# Patient Record
Sex: Male | Born: 1935 | Race: White | Hispanic: No | Marital: Married | Smoking: Former smoker
Health system: Southern US, Community
[De-identification: ages and names within clinical notes are randomized; demographics above are authoritative.]

## PROBLEM LIST (undated history)

## (undated) DIAGNOSIS — Z9109 Other allergy status, other than to drugs and biological substances: Secondary | ICD-10-CM

## (undated) DIAGNOSIS — G4730 Sleep apnea, unspecified: Secondary | ICD-10-CM

## (undated) DIAGNOSIS — I10 Essential (primary) hypertension: Secondary | ICD-10-CM

## (undated) HISTORY — PX: HERNIA REPAIR: SHX51

## (undated) HISTORY — DX: Essential (primary) hypertension: I10

## (undated) HISTORY — DX: Sleep apnea, unspecified: G47.30

## (undated) HISTORY — DX: Other allergy status, other than to drugs and biological substances: Z91.09

---

## 1999-06-12 ENCOUNTER — Ambulatory Visit (HOSPITAL_BASED_OUTPATIENT_CLINIC_OR_DEPARTMENT_OTHER): Admission: RE | Admit: 1999-06-12 | Discharge: 1999-06-12 | Payer: Self-pay | Admitting: *Deleted

## 2001-08-23 ENCOUNTER — Encounter (INDEPENDENT_AMBULATORY_CARE_PROVIDER_SITE_OTHER): Payer: Self-pay | Admitting: Specialist

## 2001-08-23 ENCOUNTER — Ambulatory Visit (HOSPITAL_COMMUNITY): Admission: RE | Admit: 2001-08-23 | Discharge: 2001-08-23 | Payer: Self-pay | Admitting: Gastroenterology

## 2005-08-28 ENCOUNTER — Encounter: Admission: RE | Admit: 2005-08-28 | Discharge: 2005-08-28 | Payer: Self-pay | Admitting: Orthopedic Surgery

## 2005-10-22 ENCOUNTER — Encounter: Admission: RE | Admit: 2005-10-22 | Discharge: 2005-10-22 | Payer: Self-pay | Admitting: Orthopedic Surgery

## 2007-04-23 ENCOUNTER — Encounter: Admission: RE | Admit: 2007-04-23 | Discharge: 2007-04-23 | Payer: Self-pay | Admitting: Family Medicine

## 2007-04-30 ENCOUNTER — Encounter: Admission: RE | Admit: 2007-04-30 | Discharge: 2007-04-30 | Payer: Self-pay | Admitting: Family Medicine

## 2007-05-13 ENCOUNTER — Encounter: Admission: RE | Admit: 2007-05-13 | Discharge: 2007-05-13 | Payer: Self-pay | Admitting: Family Medicine

## 2007-05-27 ENCOUNTER — Encounter: Admission: RE | Admit: 2007-05-27 | Discharge: 2007-05-27 | Payer: Self-pay | Admitting: Family Medicine

## 2007-06-07 ENCOUNTER — Ambulatory Visit (HOSPITAL_BASED_OUTPATIENT_CLINIC_OR_DEPARTMENT_OTHER): Admission: RE | Admit: 2007-06-07 | Discharge: 2007-06-07 | Payer: Self-pay | Admitting: Otolaryngology

## 2008-12-18 ENCOUNTER — Ambulatory Visit (HOSPITAL_COMMUNITY): Admission: RE | Admit: 2008-12-18 | Discharge: 2008-12-18 | Payer: Self-pay | Admitting: General Surgery

## 2010-05-15 ENCOUNTER — Encounter: Admission: RE | Admit: 2010-05-15 | Discharge: 2010-05-15 | Payer: Self-pay | Admitting: Family Medicine

## 2011-01-16 ENCOUNTER — Other Ambulatory Visit: Payer: Self-pay | Admitting: Family Medicine

## 2011-01-16 DIAGNOSIS — M5410 Radiculopathy, site unspecified: Secondary | ICD-10-CM

## 2011-01-23 LAB — DIFFERENTIAL
Basophils Absolute: 0 10*3/uL (ref 0.0–0.1)
Basophils Relative: 0 % (ref 0–1)
Eosinophils Absolute: 0.2 10*3/uL (ref 0.0–0.7)
Eosinophils Relative: 3 % (ref 0–5)
Lymphocytes Relative: 29 % (ref 12–46)
Lymphs Abs: 1.7 10*3/uL (ref 0.7–4.0)
Monocytes Absolute: 0.4 10*3/uL (ref 0.1–1.0)
Monocytes Relative: 6 % (ref 3–12)
Neutro Abs: 3.8 10*3/uL (ref 1.7–7.7)
Neutrophils Relative %: 62 % (ref 43–77)

## 2011-01-23 LAB — COMPREHENSIVE METABOLIC PANEL
ALT: 33 U/L (ref 0–53)
AST: 28 U/L (ref 0–37)
Albumin: 3.5 g/dL (ref 3.5–5.2)
Alkaline Phosphatase: 97 U/L (ref 39–117)
BUN: 22 mg/dL (ref 6–23)
CO2: 28 mEq/L (ref 19–32)
Calcium: 8.6 mg/dL (ref 8.4–10.5)
Chloride: 108 mEq/L (ref 96–112)
Creatinine, Ser: 1.18 mg/dL (ref 0.4–1.5)
GFR calc Af Amer: >60 mL/min (ref >60)
GFR calc non Af Amer: >60 mL/min (ref >60)
Glucose, Bld: 86 mg/dL (ref 70–99)
Potassium: 4.1 mEq/L (ref 3.5–5.1)
Sodium: 138 mEq/L (ref 135–145)
Total Bilirubin: 1 mg/dL (ref 0.3–1.2)
Total Protein: 6.2 g/dL (ref 6.0–8.3)

## 2011-01-23 LAB — CBC
HCT: 42.2 % (ref 39.0–52.0)
Hemoglobin: 14.3 g/dL (ref 13.0–17.0)
MCHC: 33.9 g/dL (ref 30.0–36.0)
MCV: 93.7 fL (ref 78.0–100.0)
Platelets: 200 10*3/uL (ref 150–400)
RBC: 4.5 MIL/uL (ref 4.22–5.81)
RDW: 12.8 % (ref 11.5–15.5)
WBC: 6.1 10*3/uL (ref 4.0–10.5)

## 2011-01-23 LAB — PROTIME-INR
INR: 1 (ref 0.00–1.49)
Prothrombin Time: 13.3 seconds (ref 11.6–15.2)

## 2011-01-23 LAB — URINALYSIS, ROUTINE W REFLEX MICROSCOPIC
Bilirubin Urine: NEGATIVE
Glucose, UA: NEGATIVE mg/dL
Hgb urine dipstick: NEGATIVE
Ketones, ur: NEGATIVE mg/dL
Nitrite: NEGATIVE
Protein, ur: NEGATIVE mg/dL
Specific Gravity, Urine: 1.02 (ref 1.005–1.030)
Urobilinogen, UA: 0.2 mg/dL (ref 0.0–1.0)
pH: 6 (ref 5.0–8.0)

## 2011-01-27 ENCOUNTER — Ambulatory Visit
Admission: RE | Admit: 2011-01-27 | Discharge: 2011-01-27 | Disposition: A | Discharge status: Home / Self Care | Payer: PRIVATE HEALTH INSURANCE | Source: Ambulatory Visit | Attending: Family Medicine | Admitting: Family Medicine

## 2011-01-27 DIAGNOSIS — M5410 Radiculopathy, site unspecified: Secondary | ICD-10-CM

## 2011-02-25 NOTE — Procedures (Signed)
NAME:  Jeremy Finley, Jeremy Finley               ACCOUNT NO.:  [id number]   MEDICAL RECORD NO.:  [id number]          PATIENT TYPE:  OUT   LOCATION:  SLEEP CENTER                 FACILITY:  [facility]   PHYSICIAN:  Clinton D. Maple Hudson, MD, FCCP, FACPDATE OF BIRTH:  08-19-37   DATE OF STUDY:  06/07/2007                            NOCTURNAL POLYSOMNOGRAM   REFERRING PHYSICIAN:  Suzanna Obey, M.D.   INDICATION FOR STUDY:  Hypersomnia with sleep apnea.   EPWORTH SLEEPINESS SCORE:  6/24, height 5 feet 10 inches, weight 177  pounds.   HOME MEDICATIONS:  Listed and reviewed.   SLEEP ARCHITECTURE:  Total sleep time 333 minutes.  Sleep efficiency  74%.  Stage I was 19%, stage II 80%, stage III is absent, REM 1% of  total sleep time.  Sleep latency 23 minutes.  REM latency 381 minutes.  Awake after sleep onset 97 minutes.  Arousal index 11.2.  No bedtime  medication was reported.   RESPIRATORY DATA:  Split study protocol.  Apnea/hypopnea index (AHI,  RDI) 35 obstructive events per hour indicating moderately severe  obstructive sleep apnea/hypopnea syndrome before CPAP.  This included 1  obstructive apnea and 109 hypopneas before CPAP.  The events and sleep  were mostly while supine.  REM AHI zero.  CPAP was titrated to 14-CWP,  AHI zero per hour.  A small Mirage Quattro full face mask was chosen  with heated humidifier.   OXYGEN DATA:  Very loud snoring before CPAP with oxygen desaturation to  a nadir of 84%.  After CPAP titration saturation held 94-96% on room  air.   CARDIAC DATA:  Normal sinus rhythm.   MOVEMENT-PARASOMNIA:  Limb jerks were frequent but rarely associated  with sleep disturbance and insignificant.  No bathroom breaks.   IMPRESSIONS-RECOMMENDATIONS:  1. Moderate obstructive sleep apnea/hypopnea syndrome, AHI 19.8 per      hour with most sleep and most events while supine.  Loud snoring      with oxygen desaturation to a nadir of 84%.  2. Successful CPAP titration to 14-CWP, AHI zero per  hour.  A small      Mirage Quattro full face mask was chosen with heated humidifier.      Clinton D. Maple Hudson, MD, [facility], FACP  Diplomate, Biomedical engineer of Sleep Medicine  Electronically Signed     CDY/MEDQ  D:  06/13/2007 13:44:41  T:  06/13/2007 04:54:09  Job:  [id number]

## 2011-02-25 NOTE — Op Note (Signed)
Jeremy Finley, Jeremy Finley               ACCOUNT NO.:  [id number]   MEDICAL RECORD NO.:  [id number]          PATIENT TYPE:  AMB   LOCATION:  DAY                          FACILITY:  [facility]   PHYSICIAN:  Adolph Pollack, M.D.DATE OF BIRTH:  1935-06-15   DATE OF PROCEDURE:  12/18/2008  DATE OF DISCHARGE:                               OPERATIVE REPORT   PREOPERATIVE DIAGNOSIS:  Left inguinal hernia.   POSTOPERATIVE DIAGNOSIS:  Indirect left inguinal hernia.   PROCEDURE:  Laparoscopic left inguinal repair with mesh.   SURGEON:  Adolph Pollack, M.D.   ANESTHESIA:  General plus Marcaine local.   INDICATION:  Jeremy Finley is a 77 year old male who was doing heavy work  in the summer then noticed a bulge in the left groin which he had to  reduce.  He had sporadically have discomfort from the area and has a  left inguinal hernia on exam and now presents for laparoscopic repair.  Procedure and risks were discussed with him preoperatively as well as  request for him to hold his aspirin for 5 days preop.   TECHNIQUE:  He was seen in the holding area and the left groin marked  with my initials.  He voided and was taken to the operating room, placed  supine on the operating table.  General anesthetic was administered.  The hair on the abdominal wall and groin area was clipped and the area  sterilely prepped and draped.  Local anesthetic was infiltrated in the  subumbilical region.  A small subumbilical incision was made through the  skin and subcutaneous tissue.  The left anterior rectus sheath was  identified and a small transverse incision made in it.  The underlying  left rectus muscle was then swept laterally exposing the posterior  rectus sheath.  A balloon dissection device was then placed into the  extraperitoneal space and under laparoscopic vision, balloon dissection  was performed.   The balloon was removed and CO2 gas was insufflated.  The laparoscope  was introduced and two 5 mm  trocars were placed just to the right of the  lower midline into the extraperitoneal space.   Using blunt dissection, I identified the symphysis pubis and Cooper's  ligament on the left in the direct area which appeared to be solid.  I  then used blunt dissection to dissect fibrofatty tissue away from the  anterior lateral abdominal walls to level of the umbilicus.  There is a  small amount of bleeding for branch of the inferior epigastric vein  which was clipped and controlled.   I then identified the spermatic cord and indirect sac.  There is also  extraperitoneal fat going up through a patulous internal ring which I  reduced.  I manipulated I dissected the sac free from the cord.  A small  hole was made in the sac but I was able to dissect the sac free from the  cord and fold the peritoneum back on itself, sealing the hole.   I did note that his tissues did seem to ooze easily with any dissected  tissue, but then this  oozing stopped after time.   A piece of Parietex mesh was brought into the field and cut to be 5 x 6  inches.  A partial longitudinal slit was cut into it and it was placed  into the left extraperitoneal space and positioned appropriately so the  two tails of the mesh were wrapped around the spermatic cord.  The mesh  was then anchored to the Cooper's ligament, the anterior and lateral  abdominal walls with spiral tacks providing adequate coverage with good  overlap of the indirect, direct and femoral spaces.   I then inspected the area and I saw no active bleeding at the time.  I  released the CO2 gas and watched the extraperitoneal contents  approximate the mesh.  I then removed all the trocars.   I then closed the anterior rectus sheath defect with interrupted 0  Vicryl sutures.  Some bleeding from subcutaneous tissue was controlled  with electrocautery.  All the skin incisions were then closed with 4-0  Monocryl subcuticular stitches followed by Steri-Strips and  sterile  dressings.   He tolerated the procedure well without any apparent complications and  was taken to recovery room in satisfactory condition.      Adolph Pollack, M.D.  Electronically Signed     TJR/MEDQ  D:  12/18/2008  T:  12/18/2008  Job:  [id number]   cc:   Mosetta Putt, M.D.  Fax: [phone]

## 2011-10-03 ENCOUNTER — Institutional Professional Consult (permissible substitution): Payer: Medicare Other | Admitting: Internal Medicine

## 2011-10-17 ENCOUNTER — Ambulatory Visit (INDEPENDENT_AMBULATORY_CARE_PROVIDER_SITE_OTHER): Payer: PRIVATE HEALTH INSURANCE | Admitting: Internal Medicine

## 2011-10-17 ENCOUNTER — Encounter: Payer: Self-pay | Admitting: Internal Medicine

## 2011-10-17 VITALS — BP 126/70 | HR 56 | Ht 70.0 in | Wt 191.0 lb

## 2011-10-17 DIAGNOSIS — G4733 Obstructive sleep apnea (adult) (pediatric): Secondary | ICD-10-CM

## 2011-10-17 NOTE — Progress Notes (Signed)
10/17/11 32 yoM former smoker referred courtesy of Dr. Duaine Dredge because of sleep apnea. He was originally diagnosed in 2008 after complaints of loud snoring and daytime sleepiness. NPSG 06/07/07 showed moderate obstructive sleep apnea, AHI 19.8 per hour with loud snoring and desaturation to 84% on room air. He was successfully titrated to CPAP of 14 and has used that since/SMS DME.  He is quite prepared to continue using CPAP as he has done, all night every night. We that he admits he does not snore and no longer has daytime sleepiness. He does find it a nuisance.  Bedtime now between 11 and 11:30 PM with sleep latency less than 5 minutes. He rarely wakes during the night before getting up at 5:30 AM. He is going to Weight Watchers and has lost 15 pounds. At his original study he weighed 177 pounds. He denies significant nasal congestion but has some history of allergic rhinitis in the past. He denies cardiopulmonary disease but is treated for hypertension. Tonsillectomy as a child. Family history of heart disease.  ROS-see HPI Constitutional:   Deliberate  weight loss, No- night sweats, fevers, chills, fatigue, lassitude. HEENT:   No-  headaches, difficulty swallowing, tooth/dental problems, sore throat,       No-  sneezing, itching, ear ache, nasal congestion, post nasal drip,  CV:  No-   chest pain, orthopnea, PND, swelling in lower extremities, anasarca, dizziness, palpitations Resp: No-   shortness of breath with exertion or at rest.              No-   productive cough,  No non-productive cough,  No- coughing up of blood.              No-   change in color of mucus.  No- wheezing.   Skin: No-   rash or lesions. GI:  No-   heartburn, indigestion, abdominal pain, nausea, vomiting, diarrhea,                 change in bowel habits, loss of appetite GU:  MS:  No-   joint pain or swelling.  No- decreased range of motion.  No- back pain. Neuro-     nothing unusual Psych:  No- change in mood or  affect. No depression or anxiety.  No memory loss.  OBJ General- Alert, Oriented, Affect-appropriate, Distress- none acute, mildly overweight Skin- rash-none, lesions- none, excoriation- none Lymphadenopathy- none Head- atraumatic            Eyes- Gross vision intact, PERRLA, conjunctivae clear secretions            Ears- Hearing, canals-normal            Nose- seems stuffy but visually normal, no-Septal dev, mucus, polyps, erosion, perforation             Throat- Mallampati II , mucosa clear , drainage- none, tonsils- absent, thick tongue base Neck- flexible , trachea midline, no stridor , thyroid nl, carotid no bruit Chest - symmetrical excursion , unlabored           Heart/CV- RRR , no murmur , no gallop  , no rub, nl s1 s2                           - JVD- none , edema- none, stasis changes- none, varices- none           Lung- clear to P&A, wheeze- none, cough- none , dullness-none, rub-  none           Chest wall-  Abd- Br/ Gen/ Rectal- Not done, not indicated Extrem- cyanosis- none, clubbing, none, atrophy- none, strength- nl Neuro- grossly intact to observation

## 2011-10-17 NOTE — Patient Instructions (Addendum)
I think your current CPAP at 14 is still a very good solution to your sleep apnea problem.   As the current machine gets older, it will be ok to get it replaced while you can keep this one for a working spare.  You might like to arrange to see Orthodontist Dr Althea Grimmer in [place] for consideration of an oral appliance to treat snoring and sleep apnea. Some people will use these exclusively or instead of CPAP while travelling.

## 2011-10-19 DIAGNOSIS — G4733 Obstructive sleep apnea (adult) (pediatric): Secondary | ICD-10-CM | POA: Insufficient documentation

## 2011-10-19 NOTE — Assessment & Plan Note (Addendum)
He admits good sleep and good control of symptoms of sleep apnea with CPAP at 14 CWP. He wishes he didn't need it. We went through a detailed discussion of the medical issues surrounding untreated sleep apnea and available treatments as well as a discussion of the impact of weight and his responsibility to be alert while driving. He would be interested in learning more about oral appliances and I have recommended Dr. Althea Grimmer, orthodontist with significant experience working with these devices for sleep apnea. We discussed other alternatives including surgery and the Provent valves.Marland Kitchen

## 2012-01-20 ENCOUNTER — Telehealth: Payer: Self-pay | Admitting: Internal Medicine

## 2012-01-20 DIAGNOSIS — G4733 Obstructive sleep apnea (adult) (pediatric): Secondary | ICD-10-CM

## 2012-01-20 NOTE — Telephone Encounter (Signed)
Per Dr. Henrietta Hoover office, pt needs a referral to them for the dental appliance. Pls advise if okay to send. This was the plan per 10/17/11 OV.

## 2012-01-20 NOTE — Telephone Encounter (Signed)
Order placed to [facility] to send order to Dr Myrtis Ser for oral appliance

## 2012-01-20 NOTE — Telephone Encounter (Signed)
Yes  Order referral to Dr Althea Grimmer for oral appliance to treat sleep apnea and snoring,   Dx OSA

## 2013-08-18 ENCOUNTER — Ambulatory Visit
Admission: RE | Admit: 2013-08-18 | Discharge: 2013-08-18 | Disposition: A | Discharge status: Home / Self Care | Payer: Medicare Other | Source: Ambulatory Visit | Attending: Family Medicine | Admitting: Family Medicine

## 2013-08-18 ENCOUNTER — Other Ambulatory Visit: Payer: Self-pay | Admitting: Family Medicine

## 2013-08-18 DIAGNOSIS — R0989 Other specified symptoms and signs involving the circulatory and respiratory systems: Secondary | ICD-10-CM

## 2013-10-03 ENCOUNTER — Ambulatory Visit (INDEPENDENT_AMBULATORY_CARE_PROVIDER_SITE_OTHER): Payer: Medicare Other | Admitting: Internal Medicine

## 2013-10-03 ENCOUNTER — Encounter: Payer: Self-pay | Admitting: Internal Medicine

## 2013-10-03 ENCOUNTER — Encounter (INDEPENDENT_AMBULATORY_CARE_PROVIDER_SITE_OTHER): Payer: Self-pay

## 2013-10-03 VITALS — BP 140/88 | HR 55 | Ht 70.0 in | Wt 197.0 lb

## 2013-10-03 DIAGNOSIS — G4733 Obstructive sleep apnea (adult) (pediatric): Secondary | ICD-10-CM

## 2013-10-03 NOTE — Patient Instructions (Signed)
Order- DME  [facility]- replacement CPAP mask of choice and supplies. Continue pressure at 14.   Dx OSA  Please call as needed

## 2013-10-03 NOTE — Progress Notes (Signed)
10/17/11 46 yoM former smoker referred courtesy of Dr. Duaine Dredge because of sleep apnea. He was originally diagnosed in 2008 after complaints of loud snoring and daytime sleepiness. NPSG 06/07/07 showed moderate obstructive sleep apnea, AHI 19.8 per hour with loud snoring and desaturation to 84% on room air. He was successfully titrated to CPAP of 14 and has used that since/SMS DME.  He is quite prepared to continue using CPAP as he has done, all night every night. We that he admits he does not snore and no longer has daytime sleepiness. He does find it a nuisance.  Bedtime now between 11 and 11:30 PM with sleep latency less than 5 minutes. He rarely wakes during the night before getting up at 5:30 AM. He is going to Weight [employer]chers and has lost 15 pounds. At his original study he weighed 177 pounds. He denies significant nasal congestion but has some history of allergic rhinitis in the past. He denies cardiopulmonary disease but is treated for hypertension. Tonsillectomy as a child. Family history of heart disease.  10/03/13- 29 yoM former smoker referred courtesy of Dr. Duaine Dredge because of sleep apnea. FOLLOWS FOR:  Not wearing CPAP x3 months due to leakage in mask.  [facility] needs order placed and OV with Dr. to recieve anything  CPAP 14/[facility]. He says it worked very well when the mask fit without leaking. He did not like oral appliance-ineffective.  ROS-see HPI Constitutional:   Deliberate  weight loss, No- night sweats, fevers, chills, fatigue, lassitude. HEENT:   No-  headaches, difficulty swallowing, tooth/dental problems, sore throat,       No-  sneezing, itching, ear ache, nasal congestion, post nasal drip,  CV:  No-   chest pain, orthopnea, PND, swelling in lower extremities, anasarca, dizziness, palpitations Resp: No-   shortness of breath with exertion or at rest.              No-   productive cough,  No non-productive cough,  No- coughing up of blood.              No-   change in color  of mucus.  No- wheezing.   Skin: No-   rash or lesions. GI:  No-   heartburn, indigestion, abdominal pain, nausea, vomiting,  GU:  MS:  No-   joint pain or swelling.  . Neuro-     nothing unusual Psych:  No- change in mood or affect. No depression or anxiety.  No memory loss.  OBJ General- Alert, Oriented, Affect-appropriate, Distress- none acute, mildly overweight Skin- rash-none, lesions- none, excoriation- none Lymphadenopathy- none Head- atraumatic            Eyes- Gross vision intact, PERRLA, conjunctivae clear secretions            Ears- Hearing, canals-normal            Nose- seems stuffy but visually normal, no-Septal dev, mucus, polyps, erosion, perforation             Throat- Mallampati II , mucosa clear , drainage- none, tonsils- absent,  thick tongue base Neck- flexible , trachea midline, no stridor , thyroid nl, carotid no bruit Chest - symmetrical excursion , unlabored           Heart/CV- RRR , no murmur , no gallop  , no rub, nl s1 s2                           -  JVD- none , edema- none, stasis changes- none, varices- none           Lung- clear to P&A, wheeze- none, cough- none , dullness-none, rub- none           Chest wall-  Abd- Br/ Gen/ Rectal- Not done, not indicated Extrem- cyanosis- none, clubbing, none, atrophy- none, strength- nl Neuro- grossly intact to observation

## 2013-10-18 DIAGNOSIS — E785 Hyperlipidemia, unspecified: Secondary | ICD-10-CM | POA: Diagnosis not present

## 2013-10-24 ENCOUNTER — Other Ambulatory Visit: Payer: Self-pay | Admitting: Family Medicine

## 2013-10-24 DIAGNOSIS — E785 Hyperlipidemia, unspecified: Secondary | ICD-10-CM | POA: Diagnosis not present

## 2013-10-24 DIAGNOSIS — M5416 Radiculopathy, lumbar region: Secondary | ICD-10-CM

## 2013-10-24 DIAGNOSIS — M5126 Other intervertebral disc displacement, lumbar region: Secondary | ICD-10-CM | POA: Diagnosis not present

## 2013-10-29 NOTE — Assessment & Plan Note (Signed)
Good compliance and control until his mask failed. Plan-continue CPAP at 14/[facility]. Replacement mask and supplies

## 2013-10-31 ENCOUNTER — Inpatient Hospital Stay: Admission: RE | Admit: 2013-10-31 | Payer: Medicare Other | Source: Ambulatory Visit

## 2013-11-01 DIAGNOSIS — L570 Actinic keratosis: Secondary | ICD-10-CM | POA: Diagnosis not present

## 2013-11-01 DIAGNOSIS — L719 Rosacea, unspecified: Secondary | ICD-10-CM | POA: Diagnosis not present

## 2013-11-03 ENCOUNTER — Ambulatory Visit
Admission: RE | Admit: 2013-11-03 | Discharge: 2013-11-03 | Disposition: A | Discharge status: Home / Self Care | Payer: Medicare Other | Source: Ambulatory Visit | Attending: Family Medicine | Admitting: Family Medicine

## 2013-11-03 DIAGNOSIS — M5137 Other intervertebral disc degeneration, lumbosacral region: Secondary | ICD-10-CM | POA: Diagnosis not present

## 2013-11-03 DIAGNOSIS — M5416 Radiculopathy, lumbar region: Secondary | ICD-10-CM

## 2013-11-03 DIAGNOSIS — M47817 Spondylosis without myelopathy or radiculopathy, lumbosacral region: Secondary | ICD-10-CM | POA: Diagnosis not present

## 2013-11-08 ENCOUNTER — Other Ambulatory Visit: Payer: Self-pay | Admitting: Family Medicine

## 2013-11-08 DIAGNOSIS — IMO0002 Reserved for concepts with insufficient information to code with codable children: Secondary | ICD-10-CM

## 2013-11-21 ENCOUNTER — Other Ambulatory Visit: Payer: Medicare Other

## 2013-11-22 ENCOUNTER — Ambulatory Visit
Admission: RE | Admit: 2013-11-22 | Discharge: 2013-11-22 | Disposition: A | Discharge status: Home / Self Care | Payer: Medicare Other | Source: Ambulatory Visit | Attending: Family Medicine | Admitting: Family Medicine

## 2013-11-22 DIAGNOSIS — IMO0002 Reserved for concepts with insufficient information to code with codable children: Secondary | ICD-10-CM

## 2013-11-22 DIAGNOSIS — M545 Low back pain, unspecified: Secondary | ICD-10-CM | POA: Diagnosis not present

## 2013-11-22 DIAGNOSIS — M47817 Spondylosis without myelopathy or radiculopathy, lumbosacral region: Secondary | ICD-10-CM | POA: Diagnosis not present

## 2013-11-22 MED ORDER — METHYLPREDNISOLONE ACETATE 40 MG/ML INJ SUSP (RADIOLOG
120.0000 mg | Freq: Once | INTRAMUSCULAR | Status: AC
  Administered 2013-11-22: 120 mg via EPIDURAL

## 2013-11-22 MED ORDER — IOHEXOL 180 MG/ML  SOLN
1.0000 mL | Freq: Once | INTRAMUSCULAR | Status: AC | PRN
  Administered 2013-11-22: 1 mL via EPIDURAL

## 2013-11-22 NOTE — Discharge Instructions (Signed)

## 2013-12-02 ENCOUNTER — Encounter: Payer: Self-pay | Admitting: Internal Medicine

## 2013-12-02 ENCOUNTER — Ambulatory Visit (INDEPENDENT_AMBULATORY_CARE_PROVIDER_SITE_OTHER): Payer: Medicare Other | Admitting: Internal Medicine

## 2013-12-02 VITALS — BP 124/80 | HR 58 | Ht 70.0 in | Wt 196.0 lb

## 2013-12-02 DIAGNOSIS — G4733 Obstructive sleep apnea (adult) (pediatric): Secondary | ICD-10-CM

## 2013-12-02 NOTE — Progress Notes (Signed)
10/17/11 48 yoM former smoker referred courtesy of Dr. Sandi Mariscal because of sleep apnea. He was originally diagnosed in 2008 after complaints of loud snoring and daytime sleepiness. NPSG 06/07/07 showed moderate obstructive sleep apnea, AHI 19.8 per hour with loud snoring and desaturation to 84% on room air. He was successfully titrated to CPAP of 14 and has used that since/SMS DME.  He is quite prepared to continue using CPAP as he has done, all night every night. We that he admits he does not snore and no longer has daytime sleepiness. He does find it a nuisance.  Bedtime now between 11 and 11:30 PM with sleep latency less than 5 minutes. He rarely wakes during the night before getting up at 5:30 AM. He is going to Weight Watchers and has lost 15 pounds. At his original study he weighed 177 pounds. He denies significant nasal congestion but has some history of allergic rhinitis in the past. He denies cardiopulmonary disease but is treated for hypertension. Tonsillectomy as a child. Family history of heart disease.  10/03/13- 68 yoM former smoker referred courtesy of Dr. Sandi Mariscal because of sleep apnea. FOLLOWS FOR:  Not wearing CPAP x3 months due to leakage in mask.  [facility] needs order placed and OV with Dr. to recieve anything  CPAP 14/[facility]. He says it worked very well when the mask fit without leaking. He did not like oral appliance-ineffective.  12/02/13- 8 yoM former smoker referred courtesy of Dr. Sandi Mariscal because of sleep apnea. FOLLOWS FOR:  Wearing CPAP 14/ [facility]  6-8 hours per night--No concerns today Failed oral appliance with Dr. Rodney Langton download is pending  ROS-see HPI Constitutional:   Deliberate  weight loss, No- night sweats, fevers, chills, fatigue, lassitude. HEENT:   No-  headaches, difficulty swallowing, tooth/dental problems, sore throat,       No-  sneezing, itching, ear ache, nasal congestion, post nasal drip,  CV:  No-   chest pain, orthopnea, PND, swelling in  lower extremities, anasarca, dizziness, palpitations Resp: No-   shortness of breath with exertion or at rest.              No-   productive cough,  No non-productive cough,  No- coughing up of blood.              No-   change in color of mucus.  No- wheezing.   Skin: No-   rash or lesions. GI:  No-   heartburn, indigestion, abdominal pain, nausea, vomiting,  GU:  MS:  No-   joint pain or swelling.  . Neuro-     nothing unusual Psych:  No- change in mood or affect. No depression or anxiety.  No memory loss.  OBJ General- Alert, Oriented, Affect-appropriate, Distress- none acute, mildly overweight Skin- rash-none, lesions- none, excoriation- none Lymphadenopathy- none Head- atraumatic            Eyes- Gross vision intact, PERRLA, conjunctivae clear secretions            Ears- Hearing, canals-normal            Nose- seems stuffy but visually normal, no-Septal dev, mucus, polyps, erosion, perforation             Throat- Mallampati II , mucosa clear , drainage- none, tonsils- absent,  thick tongue base Neck- flexible , trachea midline, no stridor , thyroid nl, carotid no bruit Chest - symmetrical excursion , unlabored           Heart/CV- RRR ,  no murmur , no gallop  , no rub, nl s1 s2                           - JVD- none , edema- none, stasis changes- none, varices- none           Lung- clear to P&A, wheeze- none, cough- none , dullness-none, rub- none           Chest wall-  Abd- Br/ Gen/ Rectal- Not done, not indicated Extrem- cyanosis- none, clubbing, none, atrophy- none, strength- nl Neuro- grossly intact to observation

## 2013-12-02 NOTE — Patient Instructions (Signed)
We can continue CPAP 14/ [facility]  We will be watching for the download. If that results suggests a different pressure might be better, we can ask [facility] to make that change. Please let us know if we make a change you don't like.

## 2013-12-15 ENCOUNTER — Other Ambulatory Visit: Payer: Self-pay | Admitting: Family Medicine

## 2013-12-15 DIAGNOSIS — IMO0002 Reserved for concepts with insufficient information to code with codable children: Secondary | ICD-10-CM

## 2013-12-25 NOTE — Assessment & Plan Note (Signed)
He is satisfied to continue CPAP 14. We discussed comfort issues. Waiting for download.

## 2014-01-02 ENCOUNTER — Telehealth: Payer: Self-pay | Admitting: Internal Medicine

## 2014-01-02 NOTE — Telephone Encounter (Signed)
Pt called back & states that he called the wrong doctor & nothing further is needed at this time.  Jeremy Finley

## 2014-01-03 ENCOUNTER — Other Ambulatory Visit: Payer: Medicare Other

## 2014-01-12 ENCOUNTER — Ambulatory Visit
Admission: RE | Admit: 2014-01-12 | Discharge: 2014-01-12 | Disposition: A | Discharge status: Home / Self Care | Payer: Medicare Other | Source: Ambulatory Visit | Attending: Family Medicine | Admitting: Family Medicine

## 2014-01-12 DIAGNOSIS — M545 Low back pain, unspecified: Secondary | ICD-10-CM | POA: Diagnosis not present

## 2014-01-12 DIAGNOSIS — IMO0002 Reserved for concepts with insufficient information to code with codable children: Secondary | ICD-10-CM

## 2014-01-12 MED ORDER — METHYLPREDNISOLONE ACETATE 40 MG/ML INJ SUSP (RADIOLOG
120.0000 mg | Freq: Once | INTRAMUSCULAR | Status: AC
  Administered 2014-01-12: 120 mg via EPIDURAL

## 2014-01-12 MED ORDER — IOHEXOL 180 MG/ML  SOLN
1.0000 mL | Freq: Once | INTRAMUSCULAR | Status: AC | PRN
  Administered 2014-01-12: 1 mL via EPIDURAL

## 2014-01-12 NOTE — Discharge Instructions (Signed)

## 2014-01-16 DIAGNOSIS — E782 Mixed hyperlipidemia: Secondary | ICD-10-CM | POA: Diagnosis not present

## 2014-01-16 DIAGNOSIS — E785 Hyperlipidemia, unspecified: Secondary | ICD-10-CM | POA: Diagnosis not present

## 2014-01-20 DIAGNOSIS — M5137 Other intervertebral disc degeneration, lumbosacral region: Secondary | ICD-10-CM | POA: Diagnosis not present

## 2014-01-20 DIAGNOSIS — E785 Hyperlipidemia, unspecified: Secondary | ICD-10-CM | POA: Diagnosis not present

## 2014-02-16 DIAGNOSIS — H5210 Myopia, unspecified eye: Secondary | ICD-10-CM | POA: Diagnosis not present

## 2014-02-16 DIAGNOSIS — H52229 Regular astigmatism, unspecified eye: Secondary | ICD-10-CM | POA: Diagnosis not present

## 2014-02-16 DIAGNOSIS — H2589 Other age-related cataract: Secondary | ICD-10-CM | POA: Diagnosis not present

## 2014-02-16 DIAGNOSIS — H10439 Chronic follicular conjunctivitis, unspecified eye: Secondary | ICD-10-CM | POA: Diagnosis not present

## 2014-04-03 DIAGNOSIS — R404 Transient alteration of awareness: Secondary | ICD-10-CM | POA: Diagnosis not present

## 2014-04-03 DIAGNOSIS — M5137 Other intervertebral disc degeneration, lumbosacral region: Secondary | ICD-10-CM | POA: Diagnosis not present

## 2014-04-03 DIAGNOSIS — M25569 Pain in unspecified knee: Secondary | ICD-10-CM | POA: Diagnosis not present

## 2014-04-11 ENCOUNTER — Other Ambulatory Visit: Payer: Self-pay | Admitting: Family Medicine

## 2014-04-11 DIAGNOSIS — M5136 Other intervertebral disc degeneration, lumbar region: Secondary | ICD-10-CM

## 2014-04-26 ENCOUNTER — Ambulatory Visit
Admission: RE | Admit: 2014-04-26 | Discharge: 2014-04-26 | Disposition: A | Discharge status: Home / Self Care | Payer: Medicare Other | Source: Ambulatory Visit | Attending: Family Medicine | Admitting: Family Medicine

## 2014-04-26 VITALS — BP 144/73 | HR 52

## 2014-04-26 DIAGNOSIS — IMO0002 Reserved for concepts with insufficient information to code with codable children: Secondary | ICD-10-CM | POA: Diagnosis not present

## 2014-04-26 DIAGNOSIS — M5136 Other intervertebral disc degeneration, lumbar region: Secondary | ICD-10-CM

## 2014-04-26 MED ORDER — IOHEXOL 180 MG/ML  SOLN
1.0000 mL | Freq: Once | INTRAMUSCULAR | Status: AC | PRN
  Administered 2014-04-26: 1 mL via EPIDURAL

## 2014-04-26 MED ORDER — METHYLPREDNISOLONE ACETATE 40 MG/ML INJ SUSP (RADIOLOG
120.0000 mg | Freq: Once | INTRAMUSCULAR | Status: AC
  Administered 2014-04-26: 120 mg via EPIDURAL

## 2014-04-26 NOTE — Discharge Instructions (Signed)

## 2014-05-16 DIAGNOSIS — Z85828 Personal history of other malignant neoplasm of skin: Secondary | ICD-10-CM | POA: Diagnosis not present

## 2014-05-16 DIAGNOSIS — L570 Actinic keratosis: Secondary | ICD-10-CM | POA: Diagnosis not present

## 2014-05-16 DIAGNOSIS — L819 Disorder of pigmentation, unspecified: Secondary | ICD-10-CM | POA: Diagnosis not present

## 2014-05-16 DIAGNOSIS — L821 Other seborrheic keratosis: Secondary | ICD-10-CM | POA: Diagnosis not present

## 2014-07-31 DIAGNOSIS — Z23 Encounter for immunization: Secondary | ICD-10-CM | POA: Diagnosis not present

## 2014-10-03 ENCOUNTER — Encounter: Payer: Self-pay | Admitting: Internal Medicine

## 2014-10-03 ENCOUNTER — Ambulatory Visit (INDEPENDENT_AMBULATORY_CARE_PROVIDER_SITE_OTHER): Payer: Medicare Other | Admitting: Internal Medicine

## 2014-10-03 ENCOUNTER — Encounter (INDEPENDENT_AMBULATORY_CARE_PROVIDER_SITE_OTHER): Payer: Self-pay

## 2014-10-03 VITALS — BP 128/80 | HR 49 | Ht 70.0 in | Wt 204.0 lb

## 2014-10-03 DIAGNOSIS — G4733 Obstructive sleep apnea (adult) (pediatric): Secondary | ICD-10-CM

## 2014-10-03 NOTE — Progress Notes (Signed)
10/17/11 89 yoM former smoker referred courtesy of Dr. Sandi Mariscal because of sleep apnea. He was originally diagnosed in 2008 after complaints of loud snoring and daytime sleepiness. NPSG 06/07/07 showed moderate obstructive sleep apnea, AHI 19.8 per hour with loud snoring and desaturation to 84% on room air. He was successfully titrated to CPAP of 14 and has used that since/SMS DME.  He is quite prepared to continue using CPAP as he has done, all night every night. We that he admits he does not snore and no longer has daytime sleepiness. He does find it a nuisance.  Bedtime now between 11 and 11:30 PM with sleep latency less than 5 minutes. He rarely wakes during the night before getting up at 5:30 AM. He is going to Weight Watchers and has lost 15 pounds. At his original study he weighed 177 pounds. He denies significant nasal congestion but has some history of allergic rhinitis in the past. He denies cardiopulmonary disease but is treated for hypertension. Tonsillectomy as a child. Family history of heart disease.  10/03/13- 26 yoM former smoker referred courtesy of Dr. Sandi Mariscal because of sleep apnea. FOLLOWS FOR:  Not wearing CPAP x3 months due to leakage in mask.  [facility] needs order placed and OV with Dr. to recieve anything  CPAP 14/[facility]. He says it worked very well when the mask fit without leaking. He did not like oral appliance-ineffective.  12/02/13- 61 yoM former smoker referred courtesy of Dr. Sandi Mariscal because of sleep apnea. FOLLOWS FOR:  Wearing CPAP 14/ [facility]  6-8 hours per night--No concerns today Failed oral appliance with Dr. Ron Parker New download is pending  10/03/14- 29 yoM former smoker referred courtesy of Dr. Sandi Mariscal because of sleep apnea. FOLLOWS FOR: patient wears cpap 14/ [facility] 8 hours nightly. He is not having any problems.  We discussed his nasal pillows mask and comfort issues. He describes good compliance and control. Still using an oral appliance for travel but  he is concerned it would hurt his TM joints with long-term use. He feels he sleeps well.  ROS-see HPI Constitutional:   Deliberate  weight loss, No- night sweats, fevers, chills, fatigue, lassitude. HEENT:   No-  headaches, difficulty swallowing, tooth/dental problems, sore throat,       No-  sneezing, itching, ear ache, nasal congestion, post nasal drip,  CV:  No-   chest pain, orthopnea, PND, swelling in lower extremities, anasarca, dizziness, palpitations Resp: No-   shortness of breath with exertion or at rest.              No-   productive cough,  No non-productive cough,  No- coughing up of blood.              No-   change in color of mucus.  No- wheezing.   Skin: No-   rash or lesions. GI:  No-   heartburn, indigestion, abdominal pain, nausea, vomiting,  GU:  MS:  No-   joint pain or swelling.  . Neuro-     nothing unusual Psych:  No- change in mood or affect. No depression or anxiety.  No memory loss.  OBJ General- Alert, Oriented, Affect-appropriate, Distress- none acute, trim Skin- rash-none, lesions- none, excoriation- none Lymphadenopathy- none Head- atraumatic            Eyes- Gross vision intact, PERRLA, conjunctivae clear secretions            Ears- Hearing, canals-normal  Nose- seems stuffy but visually normal, no-Septal dev, mucus, polyps, erosion, perforation             Throat- Mallampati III , mucosa clear , drainage- none, tonsils- absent,  thick tongue base Neck- flexible , trachea midline, no stridor , thyroid nl, carotid no bruit Chest - symmetrical excursion , unlabored           Heart/CV- RRR , no murmur , no gallop  , no rub, nl s1 s2                           - JVD- none , edema- none, stasis changes- none, varices- none           Lung- clear to P&A, wheeze- none, cough- none , dullness-none, rub- none           Chest wall-  Abd- Br/ Gen/ Rectal- Not done, not indicated Extrem- cyanosis- none, clubbing, none, atrophy- none, strength- nl Neuro-  grossly intact to observation

## 2014-10-03 NOTE — Assessment & Plan Note (Signed)
Good compliance and control with no concerns identified. Comfort issues reviewed.

## 2014-10-03 NOTE — Patient Instructions (Signed)
We can continue CPAP 14/ Advanced, with the oral apliance for travel as you have been doing. Please call if we can help

## 2014-10-20 DIAGNOSIS — Z125 Encounter for screening for malignant neoplasm of prostate: Secondary | ICD-10-CM | POA: Diagnosis not present

## 2014-12-04 ENCOUNTER — Ambulatory Visit: Payer: Medicare Other | Admitting: Internal Medicine

## 2015-01-12 ENCOUNTER — Ambulatory Visit
Admission: RE | Admit: 2015-01-12 | Discharge: 2015-01-12 | Disposition: A | Discharge status: Home / Self Care | Payer: Medicare HMO | Source: Ambulatory Visit | Attending: Family Medicine | Admitting: Family Medicine

## 2015-01-12 ENCOUNTER — Other Ambulatory Visit: Payer: Self-pay | Admitting: Family Medicine

## 2015-01-12 DIAGNOSIS — J181 Lobar pneumonia, unspecified organism: Principal | ICD-10-CM

## 2015-01-12 DIAGNOSIS — J189 Pneumonia, unspecified organism: Secondary | ICD-10-CM

## 2015-03-08 ENCOUNTER — Other Ambulatory Visit: Payer: Self-pay | Admitting: Family Medicine

## 2016-06-30 ENCOUNTER — Other Ambulatory Visit: Payer: Self-pay | Admitting: Family Medicine

## 2016-06-30 ENCOUNTER — Ambulatory Visit
Admission: RE | Admit: 2016-06-30 | Discharge: 2016-06-30 | Disposition: A | Discharge status: Home / Self Care | Payer: Medicare HMO | Source: Ambulatory Visit | Attending: Family Medicine | Admitting: Family Medicine

## 2016-06-30 DIAGNOSIS — M25561 Pain in right knee: Secondary | ICD-10-CM

## 2016-06-30 MED ORDER — IOPAMIDOL (ISOVUE-M 200) INJECTION 41%: 1.0000 mL | Freq: Once | INTRAMUSCULAR | Status: DC

## 2016-06-30 MED ORDER — METHYLPREDNISOLONE ACETATE 40 MG/ML INJ SUSP (RADIOLOG: 120.0000 mg | Freq: Once | INTRAMUSCULAR | Status: DC

## 2016-07-17 DIAGNOSIS — Z23 Encounter for immunization: Secondary | ICD-10-CM | POA: Diagnosis not present

## 2016-07-21 DIAGNOSIS — D1801 Hemangioma of skin and subcutaneous tissue: Secondary | ICD-10-CM | POA: Diagnosis not present

## 2016-07-21 DIAGNOSIS — C44319 Basal cell carcinoma of skin of other parts of face: Secondary | ICD-10-CM | POA: Diagnosis not present

## 2016-07-21 DIAGNOSIS — L821 Other seborrheic keratosis: Secondary | ICD-10-CM | POA: Diagnosis not present

## 2016-07-21 DIAGNOSIS — L565 Disseminated superficial actinic porokeratosis (DSAP): Secondary | ICD-10-CM | POA: Diagnosis not present

## 2016-07-21 DIAGNOSIS — D225 Melanocytic nevi of trunk: Secondary | ICD-10-CM | POA: Diagnosis not present

## 2016-07-21 DIAGNOSIS — L570 Actinic keratosis: Secondary | ICD-10-CM | POA: Diagnosis not present

## 2016-07-21 DIAGNOSIS — D2271 Melanocytic nevi of right lower limb, including hip: Secondary | ICD-10-CM | POA: Diagnosis not present

## 2016-07-21 DIAGNOSIS — L814 Other melanin hyperpigmentation: Secondary | ICD-10-CM | POA: Diagnosis not present

## 2016-07-21 DIAGNOSIS — D485 Neoplasm of uncertain behavior of skin: Secondary | ICD-10-CM | POA: Diagnosis not present

## 2016-07-21 DIAGNOSIS — Z85828 Personal history of other malignant neoplasm of skin: Secondary | ICD-10-CM | POA: Diagnosis not present

## 2016-10-29 DIAGNOSIS — G4733 Obstructive sleep apnea (adult) (pediatric): Secondary | ICD-10-CM | POA: Diagnosis not present

## 2016-10-29 DIAGNOSIS — I10 Essential (primary) hypertension: Secondary | ICD-10-CM | POA: Diagnosis not present

## 2016-10-29 DIAGNOSIS — M25561 Pain in right knee: Secondary | ICD-10-CM | POA: Diagnosis not present

## 2016-10-29 DIAGNOSIS — R7302 Impaired glucose tolerance (oral): Secondary | ICD-10-CM | POA: Diagnosis not present

## 2016-10-29 DIAGNOSIS — I7300 Raynaud's syndrome without gangrene: Secondary | ICD-10-CM | POA: Diagnosis not present

## 2016-10-29 DIAGNOSIS — M5137 Other intervertebral disc degeneration, lumbosacral region: Secondary | ICD-10-CM | POA: Diagnosis not present

## 2016-10-29 DIAGNOSIS — E782 Mixed hyperlipidemia: Secondary | ICD-10-CM | POA: Diagnosis not present

## 2016-10-29 DIAGNOSIS — Z008 Encounter for other general examination: Secondary | ICD-10-CM | POA: Diagnosis not present

## 2016-10-29 DIAGNOSIS — Z0001 Encounter for general adult medical examination with abnormal findings: Secondary | ICD-10-CM | POA: Diagnosis not present

## 2016-10-29 DIAGNOSIS — R251 Tremor, unspecified: Secondary | ICD-10-CM | POA: Diagnosis not present

## 2016-11-19 DIAGNOSIS — I10 Essential (primary) hypertension: Secondary | ICD-10-CM | POA: Diagnosis not present

## 2016-12-12 DIAGNOSIS — J0110 Acute frontal sinusitis, unspecified: Secondary | ICD-10-CM | POA: Diagnosis not present

## 2016-12-12 DIAGNOSIS — M25561 Pain in right knee: Secondary | ICD-10-CM | POA: Diagnosis not present

## 2016-12-12 DIAGNOSIS — I10 Essential (primary) hypertension: Secondary | ICD-10-CM | POA: Diagnosis not present

## 2016-12-18 ENCOUNTER — Other Ambulatory Visit: Payer: Self-pay | Admitting: Family Medicine

## 2016-12-18 DIAGNOSIS — M5416 Radiculopathy, lumbar region: Secondary | ICD-10-CM

## 2016-12-31 ENCOUNTER — Ambulatory Visit
Admission: RE | Admit: 2016-12-31 | Discharge: 2016-12-31 | Disposition: A | Discharge status: Home / Self Care | Payer: Medicare HMO | Source: Ambulatory Visit | Attending: Family Medicine | Admitting: Family Medicine

## 2016-12-31 DIAGNOSIS — M48061 Spinal stenosis, lumbar region without neurogenic claudication: Secondary | ICD-10-CM | POA: Diagnosis not present

## 2016-12-31 DIAGNOSIS — M5416 Radiculopathy, lumbar region: Secondary | ICD-10-CM

## 2017-01-02 DIAGNOSIS — G4733 Obstructive sleep apnea (adult) (pediatric): Secondary | ICD-10-CM | POA: Diagnosis not present

## 2017-01-07 ENCOUNTER — Other Ambulatory Visit: Payer: Self-pay | Admitting: Family Medicine

## 2017-01-07 DIAGNOSIS — M545 Low back pain: Principal | ICD-10-CM

## 2017-01-07 DIAGNOSIS — G8929 Other chronic pain: Secondary | ICD-10-CM

## 2017-01-08 DIAGNOSIS — G4733 Obstructive sleep apnea (adult) (pediatric): Secondary | ICD-10-CM | POA: Diagnosis not present

## 2017-01-15 ENCOUNTER — Other Ambulatory Visit: Payer: Self-pay | Admitting: Family Medicine

## 2017-01-15 ENCOUNTER — Ambulatory Visit
Admission: RE | Admit: 2017-01-15 | Discharge: 2017-01-15 | Disposition: A | Discharge status: Home / Self Care | Payer: Medicare HMO | Source: Ambulatory Visit | Attending: Family Medicine | Admitting: Family Medicine

## 2017-01-15 DIAGNOSIS — M545 Low back pain: Principal | ICD-10-CM

## 2017-01-15 DIAGNOSIS — G8929 Other chronic pain: Secondary | ICD-10-CM

## 2017-01-15 DIAGNOSIS — M5126 Other intervertebral disc displacement, lumbar region: Secondary | ICD-10-CM | POA: Diagnosis not present

## 2017-01-15 MED ORDER — METHYLPREDNISOLONE ACETATE 40 MG/ML INJ SUSP (RADIOLOG
120.0000 mg | Freq: Once | INTRAMUSCULAR | Status: AC
  Administered 2017-01-15: 120 mg via EPIDURAL

## 2017-01-15 MED ORDER — IOPAMIDOL (ISOVUE-M 200) INJECTION 41%
1.0000 mL | Freq: Once | INTRAMUSCULAR | Status: AC
  Administered 2017-01-15: 1 mL via EPIDURAL

## 2017-01-15 NOTE — Discharge Instructions (Signed)

## 2017-01-19 DIAGNOSIS — L821 Other seborrheic keratosis: Secondary | ICD-10-CM | POA: Diagnosis not present

## 2017-01-19 DIAGNOSIS — Z85828 Personal history of other malignant neoplasm of skin: Secondary | ICD-10-CM | POA: Diagnosis not present

## 2017-01-19 DIAGNOSIS — D225 Melanocytic nevi of trunk: Secondary | ICD-10-CM | POA: Diagnosis not present

## 2017-01-19 DIAGNOSIS — L570 Actinic keratosis: Secondary | ICD-10-CM | POA: Diagnosis not present

## 2017-01-19 DIAGNOSIS — D2261 Melanocytic nevi of right upper limb, including shoulder: Secondary | ICD-10-CM | POA: Diagnosis not present

## 2017-01-19 DIAGNOSIS — D2262 Melanocytic nevi of left upper limb, including shoulder: Secondary | ICD-10-CM | POA: Diagnosis not present

## 2017-01-21 DIAGNOSIS — R69 Illness, unspecified: Secondary | ICD-10-CM | POA: Diagnosis not present

## 2017-01-28 ENCOUNTER — Other Ambulatory Visit: Payer: Self-pay | Admitting: Family Medicine

## 2017-01-28 DIAGNOSIS — M545 Low back pain: Principal | ICD-10-CM

## 2017-01-28 DIAGNOSIS — G8929 Other chronic pain: Secondary | ICD-10-CM

## 2017-02-03 ENCOUNTER — Other Ambulatory Visit: Payer: Self-pay | Admitting: Family Medicine

## 2017-02-03 ENCOUNTER — Ambulatory Visit
Admission: RE | Admit: 2017-02-03 | Discharge: 2017-02-03 | Disposition: A | Discharge status: Home / Self Care | Payer: Medicare HMO | Source: Ambulatory Visit | Attending: Family Medicine | Admitting: Family Medicine

## 2017-02-03 DIAGNOSIS — M545 Low back pain: Principal | ICD-10-CM

## 2017-02-03 DIAGNOSIS — M47817 Spondylosis without myelopathy or radiculopathy, lumbosacral region: Secondary | ICD-10-CM | POA: Diagnosis not present

## 2017-02-03 DIAGNOSIS — G8929 Other chronic pain: Secondary | ICD-10-CM

## 2017-02-03 MED ORDER — METHYLPREDNISOLONE ACETATE 40 MG/ML INJ SUSP (RADIOLOG
120.0000 mg | Freq: Once | INTRAMUSCULAR | Status: AC
  Administered 2017-02-03: 120 mg via EPIDURAL

## 2017-02-03 MED ORDER — IOPAMIDOL (ISOVUE-M 200) INJECTION 41%
1.0000 mL | Freq: Once | INTRAMUSCULAR | Status: AC
  Administered 2017-02-03: 1 mL via EPIDURAL

## 2017-02-03 NOTE — Discharge Instructions (Signed)

## 2017-02-16 DIAGNOSIS — J0110 Acute frontal sinusitis, unspecified: Secondary | ICD-10-CM | POA: Diagnosis not present

## 2017-02-18 DIAGNOSIS — I10 Essential (primary) hypertension: Secondary | ICD-10-CM | POA: Diagnosis not present

## 2017-02-20 DIAGNOSIS — H5213 Myopia, bilateral: Secondary | ICD-10-CM | POA: Diagnosis not present

## 2017-02-20 DIAGNOSIS — H52223 Regular astigmatism, bilateral: Secondary | ICD-10-CM | POA: Diagnosis not present

## 2017-02-20 DIAGNOSIS — H25813 Combined forms of age-related cataract, bilateral: Secondary | ICD-10-CM | POA: Diagnosis not present

## 2017-02-23 DIAGNOSIS — I10 Essential (primary) hypertension: Secondary | ICD-10-CM | POA: Diagnosis not present

## 2017-03-04 DIAGNOSIS — I10 Essential (primary) hypertension: Secondary | ICD-10-CM | POA: Diagnosis not present

## 2017-04-27 DIAGNOSIS — R972 Elevated prostate specific antigen [PSA]: Secondary | ICD-10-CM | POA: Diagnosis not present

## 2017-05-13 DIAGNOSIS — R21 Rash and other nonspecific skin eruption: Secondary | ICD-10-CM | POA: Diagnosis not present

## 2017-05-13 DIAGNOSIS — I10 Essential (primary) hypertension: Secondary | ICD-10-CM | POA: Diagnosis not present

## 2017-06-10 DIAGNOSIS — I10 Essential (primary) hypertension: Secondary | ICD-10-CM | POA: Diagnosis not present

## 2017-07-21 DIAGNOSIS — G4733 Obstructive sleep apnea (adult) (pediatric): Secondary | ICD-10-CM | POA: Diagnosis not present

## 2017-07-21 DIAGNOSIS — L570 Actinic keratosis: Secondary | ICD-10-CM | POA: Diagnosis not present

## 2017-07-21 DIAGNOSIS — L821 Other seborrheic keratosis: Secondary | ICD-10-CM | POA: Diagnosis not present

## 2017-07-21 DIAGNOSIS — D485 Neoplasm of uncertain behavior of skin: Secondary | ICD-10-CM | POA: Diagnosis not present

## 2017-07-21 DIAGNOSIS — D225 Melanocytic nevi of trunk: Secondary | ICD-10-CM | POA: Diagnosis not present

## 2017-07-21 DIAGNOSIS — Z85828 Personal history of other malignant neoplasm of skin: Secondary | ICD-10-CM | POA: Diagnosis not present

## 2017-07-21 DIAGNOSIS — C44629 Squamous cell carcinoma of skin of left upper limb, including shoulder: Secondary | ICD-10-CM | POA: Diagnosis not present

## 2017-07-27 DIAGNOSIS — Z23 Encounter for immunization: Secondary | ICD-10-CM | POA: Diagnosis not present

## 2017-07-29 DIAGNOSIS — R69 Illness, unspecified: Secondary | ICD-10-CM | POA: Diagnosis not present

## 2017-08-12 DIAGNOSIS — N3941 Urge incontinence: Secondary | ICD-10-CM | POA: Diagnosis not present

## 2017-08-12 DIAGNOSIS — R972 Elevated prostate specific antigen [PSA]: Secondary | ICD-10-CM | POA: Diagnosis not present

## 2017-08-13 DIAGNOSIS — R69 Illness, unspecified: Secondary | ICD-10-CM | POA: Diagnosis not present

## 2017-08-18 DIAGNOSIS — H2513 Age-related nuclear cataract, bilateral: Secondary | ICD-10-CM | POA: Diagnosis not present

## 2017-08-18 DIAGNOSIS — H52223 Regular astigmatism, bilateral: Secondary | ICD-10-CM | POA: Diagnosis not present

## 2017-08-18 DIAGNOSIS — H5213 Myopia, bilateral: Secondary | ICD-10-CM | POA: Diagnosis not present

## 2017-09-23 DIAGNOSIS — M5117 Intervertebral disc disorders with radiculopathy, lumbosacral region: Secondary | ICD-10-CM | POA: Diagnosis not present

## 2017-09-23 DIAGNOSIS — S336XXA Sprain of sacroiliac joint, initial encounter: Secondary | ICD-10-CM | POA: Diagnosis not present

## 2017-09-28 ENCOUNTER — Other Ambulatory Visit: Payer: Self-pay | Admitting: Family Medicine

## 2017-09-28 DIAGNOSIS — M533 Sacrococcygeal disorders, not elsewhere classified: Secondary | ICD-10-CM

## 2017-09-29 ENCOUNTER — Other Ambulatory Visit: Payer: Self-pay | Admitting: Family Medicine

## 2017-09-29 DIAGNOSIS — M533 Sacrococcygeal disorders, not elsewhere classified: Secondary | ICD-10-CM

## 2017-10-15 ENCOUNTER — Ambulatory Visit
Admission: RE | Admit: 2017-10-15 | Discharge: 2017-10-15 | Disposition: A | Discharge status: Home / Self Care | Payer: Medicare HMO | Source: Ambulatory Visit | Attending: Family Medicine | Admitting: Family Medicine

## 2017-10-15 DIAGNOSIS — M533 Sacrococcygeal disorders, not elsewhere classified: Secondary | ICD-10-CM

## 2017-10-15 MED ORDER — IOPAMIDOL (ISOVUE-M 200) INJECTION 41%
1.0000 mL | Freq: Once | INTRAMUSCULAR | Status: AC
  Administered 2017-10-15: 1 mL via INTRA_ARTICULAR

## 2017-10-15 MED ORDER — METHYLPREDNISOLONE ACETATE 40 MG/ML INJ SUSP (RADIOLOG
120.0000 mg | Freq: Once | INTRAMUSCULAR | Status: AC
  Administered 2017-10-15: 120 mg via INTRA_ARTICULAR

## 2017-10-15 NOTE — Discharge Instructions (Signed)

## 2017-10-16 DIAGNOSIS — M5117 Intervertebral disc disorders with radiculopathy, lumbosacral region: Secondary | ICD-10-CM | POA: Diagnosis not present

## 2017-10-16 DIAGNOSIS — I10 Essential (primary) hypertension: Secondary | ICD-10-CM | POA: Diagnosis not present

## 2017-10-16 DIAGNOSIS — R69 Illness, unspecified: Secondary | ICD-10-CM | POA: Diagnosis not present

## 2017-11-10 ENCOUNTER — Other Ambulatory Visit: Payer: Medicare HMO

## 2017-11-11 DIAGNOSIS — Z6829 Body mass index (BMI) 29.0-29.9, adult: Secondary | ICD-10-CM | POA: Diagnosis not present

## 2017-11-11 DIAGNOSIS — E663 Overweight: Secondary | ICD-10-CM | POA: Diagnosis not present

## 2017-11-11 DIAGNOSIS — E7800 Pure hypercholesterolemia, unspecified: Secondary | ICD-10-CM | POA: Diagnosis not present

## 2017-11-11 DIAGNOSIS — R69 Illness, unspecified: Secondary | ICD-10-CM | POA: Diagnosis not present

## 2017-11-11 DIAGNOSIS — Z79899 Other long term (current) drug therapy: Secondary | ICD-10-CM | POA: Diagnosis not present

## 2017-11-11 DIAGNOSIS — G4733 Obstructive sleep apnea (adult) (pediatric): Secondary | ICD-10-CM | POA: Diagnosis not present

## 2017-11-11 DIAGNOSIS — D696 Thrombocytopenia, unspecified: Secondary | ICD-10-CM | POA: Diagnosis not present

## 2017-11-11 DIAGNOSIS — I10 Essential (primary) hypertension: Secondary | ICD-10-CM | POA: Diagnosis not present

## 2017-12-22 ENCOUNTER — Ambulatory Visit
Admission: RE | Admit: 2017-12-22 | Discharge: 2017-12-22 | Disposition: A | Discharge status: Home / Self Care | Payer: Medicare HMO | Source: Ambulatory Visit | Attending: Family Medicine | Admitting: Family Medicine

## 2017-12-22 DIAGNOSIS — M533 Sacrococcygeal disorders, not elsewhere classified: Secondary | ICD-10-CM

## 2017-12-22 MED ORDER — IOPAMIDOL (ISOVUE-M 200) INJECTION 41%
1.0000 mL | Freq: Once | INTRAMUSCULAR | Status: AC
  Administered 2017-12-22: 1 mL via INTRA_ARTICULAR

## 2017-12-22 MED ORDER — METHYLPREDNISOLONE ACETATE 40 MG/ML INJ SUSP (RADIOLOG
120.0000 mg | Freq: Once | INTRAMUSCULAR | Status: AC
  Administered 2017-12-22: 120 mg via INTRA_ARTICULAR

## 2017-12-22 NOTE — Discharge Instructions (Signed)

## 2018-01-18 DIAGNOSIS — L821 Other seborrheic keratosis: Secondary | ICD-10-CM | POA: Diagnosis not present

## 2018-01-18 DIAGNOSIS — Z85828 Personal history of other malignant neoplasm of skin: Secondary | ICD-10-CM | POA: Diagnosis not present

## 2018-01-18 DIAGNOSIS — L570 Actinic keratosis: Secondary | ICD-10-CM | POA: Diagnosis not present

## 2018-01-18 DIAGNOSIS — L820 Inflamed seborrheic keratosis: Secondary | ICD-10-CM | POA: Diagnosis not present

## 2018-01-18 DIAGNOSIS — D225 Melanocytic nevi of trunk: Secondary | ICD-10-CM | POA: Diagnosis not present

## 2018-01-18 DIAGNOSIS — L814 Other melanin hyperpigmentation: Secondary | ICD-10-CM | POA: Diagnosis not present

## 2018-01-28 DIAGNOSIS — R69 Illness, unspecified: Secondary | ICD-10-CM | POA: Diagnosis not present

## 2018-02-24 DIAGNOSIS — R972 Elevated prostate specific antigen [PSA]: Secondary | ICD-10-CM | POA: Diagnosis not present

## 2018-02-24 DIAGNOSIS — N3941 Urge incontinence: Secondary | ICD-10-CM | POA: Diagnosis not present

## 2018-03-05 DIAGNOSIS — H52223 Regular astigmatism, bilateral: Secondary | ICD-10-CM | POA: Diagnosis not present

## 2018-03-05 DIAGNOSIS — H5213 Myopia, bilateral: Secondary | ICD-10-CM | POA: Diagnosis not present

## 2018-03-05 DIAGNOSIS — H04123 Dry eye syndrome of bilateral lacrimal glands: Secondary | ICD-10-CM | POA: Diagnosis not present

## 2018-03-05 DIAGNOSIS — H25813 Combined forms of age-related cataract, bilateral: Secondary | ICD-10-CM | POA: Diagnosis not present

## 2018-03-10 DIAGNOSIS — G4733 Obstructive sleep apnea (adult) (pediatric): Secondary | ICD-10-CM | POA: Diagnosis not present
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
## 2018-05-11 DIAGNOSIS — I10 Essential (primary) hypertension: Secondary | ICD-10-CM | POA: Diagnosis not present

## 2018-05-11 DIAGNOSIS — G4733 Obstructive sleep apnea (adult) (pediatric): Secondary | ICD-10-CM | POA: Diagnosis not present

## 2018-05-11 DIAGNOSIS — D696 Thrombocytopenia, unspecified: Secondary | ICD-10-CM | POA: Diagnosis not present

## 2018-05-11 DIAGNOSIS — Z79899 Other long term (current) drug therapy: Secondary | ICD-10-CM | POA: Diagnosis not present

## 2018-05-11 DIAGNOSIS — R69 Illness, unspecified: Secondary | ICD-10-CM | POA: Diagnosis not present

## 2018-05-11 DIAGNOSIS — E7800 Pure hypercholesterolemia, unspecified: Secondary | ICD-10-CM | POA: Diagnosis not present

## 2018-05-11 DIAGNOSIS — Z0000 Encounter for general adult medical examination without abnormal findings: Secondary | ICD-10-CM | POA: Diagnosis not present

## 2018-07-08 DIAGNOSIS — R69 Illness, unspecified: Secondary | ICD-10-CM | POA: Diagnosis not present

## 2018-07-28 DIAGNOSIS — R69 Illness, unspecified: Secondary | ICD-10-CM | POA: Diagnosis not present

## 2018-08-04 DIAGNOSIS — Z23 Encounter for immunization: Secondary | ICD-10-CM | POA: Diagnosis not present

## 2018-09-21 DIAGNOSIS — D225 Melanocytic nevi of trunk: Secondary | ICD-10-CM | POA: Diagnosis not present

## 2018-09-21 DIAGNOSIS — C44712 Basal cell carcinoma of skin of right lower limb, including hip: Secondary | ICD-10-CM | POA: Diagnosis not present

## 2018-09-21 DIAGNOSIS — L570 Actinic keratosis: Secondary | ICD-10-CM | POA: Diagnosis not present

## 2018-09-21 DIAGNOSIS — Z85828 Personal history of other malignant neoplasm of skin: Secondary | ICD-10-CM | POA: Diagnosis not present

## 2018-09-21 DIAGNOSIS — L821 Other seborrheic keratosis: Secondary | ICD-10-CM | POA: Diagnosis not present

## 2018-09-21 DIAGNOSIS — C44319 Basal cell carcinoma of skin of other parts of face: Secondary | ICD-10-CM | POA: Diagnosis not present

## 2018-09-21 DIAGNOSIS — Z85820 Personal history of malignant melanoma of skin: Secondary | ICD-10-CM | POA: Diagnosis not present

## 2019-07-02 IMAGING — XA DG FLUORO GUIDE SPINAL/SI JT INJ*R*
2 series · 2 of 2 positions shown · non-contrast
Comparison: none

CLINICAL DATA: Suspected sacroiliac dysfunction.

[Series 1: ortho standard · 1 of 1 slices shown (1 of 2)]
[im 1/1]
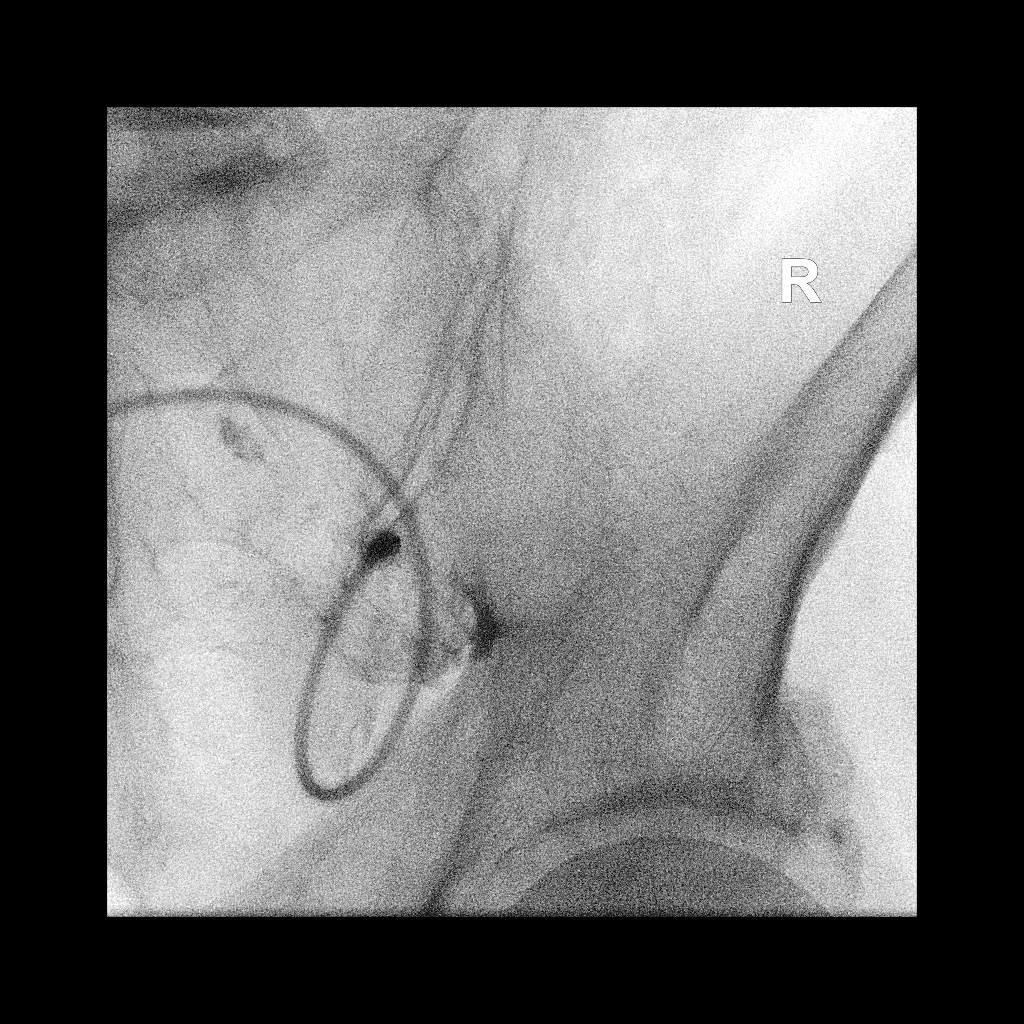

[Series 2: ortho standard · 1 of 1 slices shown (2 of 2)]
[im 1/1]
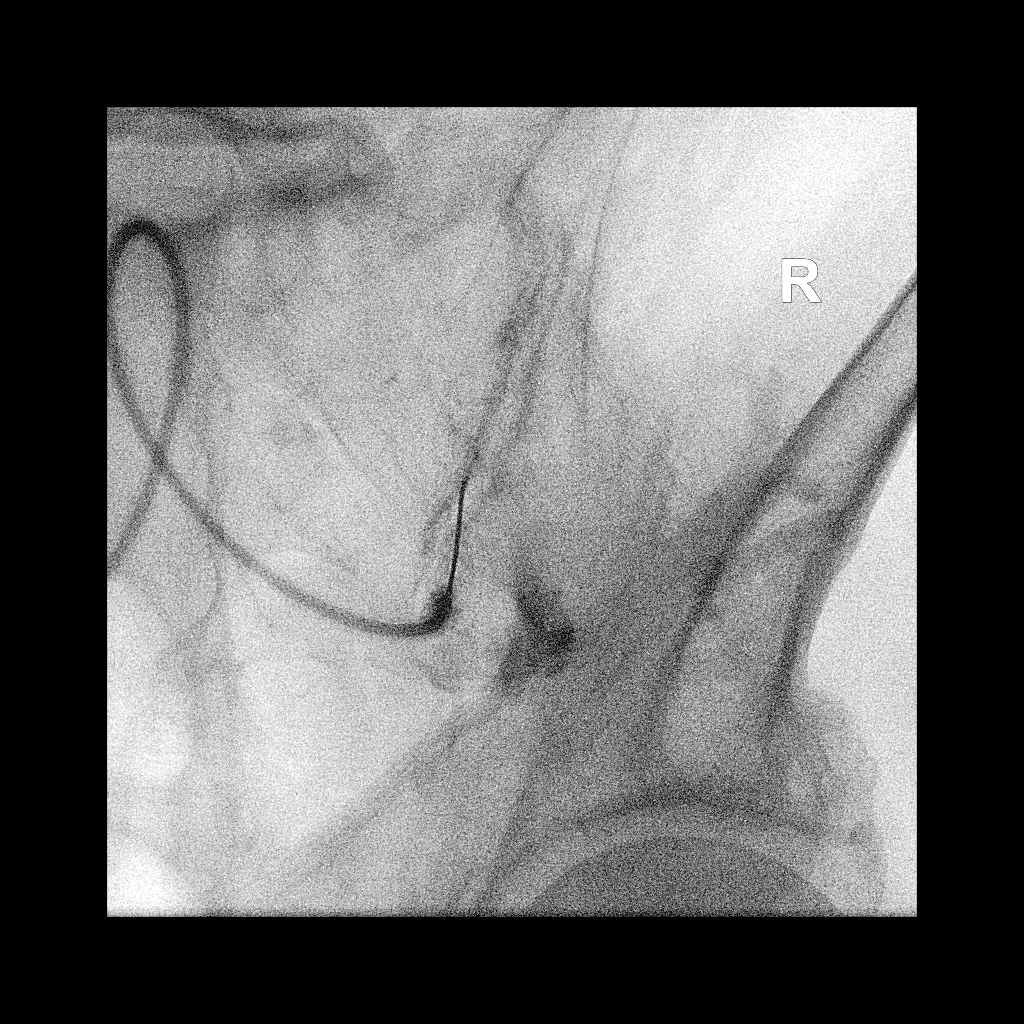

[2 of 2 positions shown; findings below may reference images not displayed]

FLUOROSCOPY TIME:  32 seconds corresponding to a Dose Area Product
of 38.61 ?Gy*m2

PROCEDURE:
RIGHT SI JOINT INJECTION.

After a thorough discussion of risks and benefits of the procedure,
including bleeding, infection, injury to nerves, blood vessels, and
adjacent structures, verbal and written consent was obtained.
Specific risks of the procedure included
nondiagnostic/nontherapeutic injection and non target injection. The
patient was placed prone on the fluoroscopy table and localization
was performed over the sacrum. Target sitemarked using fluoroscopic
guidance. The skin was prepped and draped in the usual sterile
fashion using Betadine soap.

After local anesthesia with 1% lidocaine without epinephrine and
subsequent deep anesthesia, a 22 gauge spinal needle was advanced
into the RIGHT SI joint. Injection of 0.5 ml Isovue-M 200 confirmed
intra-articular placement. No vascular uptake present. Subsequently,
120.0 of Depo-Medrol along with 2 mL 1% lidocaine was injected into
SI joint. Needles removed and a sterile dressing applied.

No complications were observed. The patient was observed and
released under the care of a driver after 30 minutes.
IMPRESSION: Successful fluoroscopically guided RIGHT SI joint injection.

## 2020-06-11 ENCOUNTER — Other Ambulatory Visit: Payer: Self-pay

## 2020-06-11 ENCOUNTER — Ambulatory Visit: Payer: Medicare HMO | Admitting: Internal Medicine

## 2020-06-11 ENCOUNTER — Encounter: Payer: Self-pay | Admitting: Internal Medicine

## 2020-06-11 VITALS — BP 120/74 | HR 68 | Temp 96.5°F | Ht 70.0 in | Wt 194.8 lb

## 2020-06-11 DIAGNOSIS — G4733 Obstructive sleep apnea (adult) (pediatric): Secondary | ICD-10-CM | POA: Diagnosis not present

## 2020-06-11 DIAGNOSIS — I10 Essential (primary) hypertension: Secondary | ICD-10-CM | POA: Diagnosis not present

## 2020-06-11 DIAGNOSIS — E7800 Pure hypercholesterolemia, unspecified: Secondary | ICD-10-CM | POA: Diagnosis not present

## 2020-06-11 DIAGNOSIS — K5790 Diverticulosis of intestine, part unspecified, without perforation or abscess without bleeding: Secondary | ICD-10-CM | POA: Insufficient documentation

## 2020-06-11 DIAGNOSIS — E785 Hyperlipidemia, unspecified: Secondary | ICD-10-CM | POA: Insufficient documentation

## 2020-06-11 NOTE — Assessment & Plan Note (Signed)
Managed by Dr Felipa Eth

## 2020-06-11 NOTE — Assessment & Plan Note (Signed)
Managed by his PCP

## 2020-06-11 NOTE — Patient Instructions (Signed)
Order- schedule home sleep test-   Off cpap and off oral appliance    Dx OSA  Order- Dme [employer]- please replace old CPAP machine auto 10-20, mask of choice, humidifier, supplies, AirView/ card  Please call us about 2 weeks after your sleep test to see if results and recommendations are ready. We should be bale then to sen a renewal over to Dr Ron Parker.  Please call if we can help

## 2020-06-11 NOTE — Assessment & Plan Note (Signed)
Successfully using both CPAP and oral appliance with benefit. Both are worn out, After discussing options, we agreed to update diagnostic sleep study, replace old CPAP with change to auto 10-20, and then refer back to Dr Ron Parker for replacement of old oral appliance.
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
## 2020-07-13 ENCOUNTER — Other Ambulatory Visit: Payer: Self-pay

## 2020-07-13 ENCOUNTER — Ambulatory Visit: Payer: Medicare Other

## 2020-07-13 DIAGNOSIS — G4733 Obstructive sleep apnea (adult) (pediatric): Secondary | ICD-10-CM

## 2020-07-18 DIAGNOSIS — G4733 Obstructive sleep apnea (adult) (pediatric): Secondary | ICD-10-CM

## 2020-08-06 ENCOUNTER — Telehealth: Payer: Self-pay | Admitting: Internal Medicine

## 2020-08-06 DIAGNOSIS — G4733 Obstructive sleep apnea (adult) (pediatric): Secondary | ICD-10-CM

## 2020-08-06 NOTE — Telephone Encounter (Signed)
Attempted to call pt but unable to reach. Left message for him to return call. °

## 2020-08-06 NOTE — Telephone Encounter (Signed)
Dr. Annamaria Boots please advise on patient HST results

## 2020-08-06 NOTE — Telephone Encounter (Signed)
His home sleep test showed obstructive sleep apnea averaging 32 apneas/ hr It is appropriate, if he wants, for [place] to order his DME to replace his old CPAP machine, auto 10-20, mask of choice, humidifier, supplies, airview/ card  He may also choose to see Dr Ron Parker about updating his oral appliance

## 2020-08-07 NOTE — Telephone Encounter (Signed)
Called and spoke to pt. Informed him of the results and recs per Dr. Annamaria Boots. New orders placed and appt changed to mid December. Pt verbalized understanding and denied any further questions or concerns at this time.

## 2020-09-10 ENCOUNTER — Ambulatory Visit: Payer: Medicare HMO | Admitting: Internal Medicine

## 2020-09-24 ENCOUNTER — Telehealth: Payer: Self-pay | Admitting: Internal Medicine

## 2020-09-24 NOTE — Telephone Encounter (Signed)
Spoke with [facility] regarding this- insurance requires [place] to see patients between 31-90 days after they receive their cpap.  Unless pt is having an acute respiratory problem we need to reschedule his appt to at least after 09/25/2021.   ATC pt to reschedule appt, line rang with no answer and no VM.  Please reschedule appt when pt calls back.

## 2020-09-25 NOTE — Telephone Encounter (Signed)
Called spoke with patient.  Let him know to call us when he knows when the machine will be delivered so we can make him an appointment.  Patient having no respiratory issues.  Nothing further needed at this time.

## 2020-09-26 ENCOUNTER — Ambulatory Visit: Payer: Medicare Other | Admitting: Internal Medicine

## 2020-10-23 DIAGNOSIS — M25552 Pain in left hip: Secondary | ICD-10-CM | POA: Diagnosis not present

## 2020-10-23 DIAGNOSIS — I129 Hypertensive chronic kidney disease with stage 1 through stage 4 chronic kidney disease, or unspecified chronic kidney disease: Secondary | ICD-10-CM | POA: Diagnosis not present

## 2020-10-23 DIAGNOSIS — E7800 Pure hypercholesterolemia, unspecified: Secondary | ICD-10-CM | POA: Diagnosis not present

## 2020-10-23 DIAGNOSIS — M25512 Pain in left shoulder: Secondary | ICD-10-CM | POA: Diagnosis not present

## 2020-11-01 DIAGNOSIS — Z961 Presence of intraocular lens: Secondary | ICD-10-CM | POA: Diagnosis not present

## 2020-11-01 DIAGNOSIS — H5203 Hypermetropia, bilateral: Secondary | ICD-10-CM | POA: Diagnosis not present

## 2020-11-05 DIAGNOSIS — M25552 Pain in left hip: Secondary | ICD-10-CM | POA: Diagnosis not present

## 2020-11-05 DIAGNOSIS — M542 Cervicalgia: Secondary | ICD-10-CM | POA: Diagnosis not present

## 2020-11-28 DIAGNOSIS — H903 Sensorineural hearing loss, bilateral: Secondary | ICD-10-CM | POA: Diagnosis not present

## 2020-12-04 DIAGNOSIS — I129 Hypertensive chronic kidney disease with stage 1 through stage 4 chronic kidney disease, or unspecified chronic kidney disease: Secondary | ICD-10-CM | POA: Diagnosis not present

## 2020-12-05 DIAGNOSIS — M542 Cervicalgia: Secondary | ICD-10-CM | POA: Diagnosis not present

## 2020-12-05 DIAGNOSIS — M25552 Pain in left hip: Secondary | ICD-10-CM | POA: Diagnosis not present

## 2020-12-10 DIAGNOSIS — C44712 Basal cell carcinoma of skin of right lower limb, including hip: Secondary | ICD-10-CM | POA: Diagnosis not present

## 2020-12-10 DIAGNOSIS — D692 Other nonthrombocytopenic purpura: Secondary | ICD-10-CM | POA: Diagnosis not present

## 2020-12-10 DIAGNOSIS — L821 Other seborrheic keratosis: Secondary | ICD-10-CM | POA: Diagnosis not present

## 2020-12-10 DIAGNOSIS — L570 Actinic keratosis: Secondary | ICD-10-CM | POA: Diagnosis not present

## 2020-12-10 DIAGNOSIS — D485 Neoplasm of uncertain behavior of skin: Secondary | ICD-10-CM | POA: Diagnosis not present

## 2020-12-10 DIAGNOSIS — Z85828 Personal history of other malignant neoplasm of skin: Secondary | ICD-10-CM | POA: Diagnosis not present

## 2020-12-19 DIAGNOSIS — M542 Cervicalgia: Secondary | ICD-10-CM | POA: Diagnosis not present

## 2020-12-26 DIAGNOSIS — M542 Cervicalgia: Secondary | ICD-10-CM | POA: Diagnosis not present

## 2021-01-01 DIAGNOSIS — I129 Hypertensive chronic kidney disease with stage 1 through stage 4 chronic kidney disease, or unspecified chronic kidney disease: Secondary | ICD-10-CM | POA: Diagnosis not present

## 2021-01-01 DIAGNOSIS — E7800 Pure hypercholesterolemia, unspecified: Secondary | ICD-10-CM | POA: Diagnosis not present

## 2021-01-01 DIAGNOSIS — N1831 Chronic kidney disease, stage 3a: Secondary | ICD-10-CM | POA: Diagnosis not present

## 2021-01-02 DIAGNOSIS — M542 Cervicalgia: Secondary | ICD-10-CM | POA: Diagnosis not present

## 2021-01-09 DIAGNOSIS — M542 Cervicalgia: Secondary | ICD-10-CM | POA: Diagnosis not present

## 2021-01-10 DIAGNOSIS — G4733 Obstructive sleep apnea (adult) (pediatric): Secondary | ICD-10-CM | POA: Diagnosis not present

## 2021-01-16 DIAGNOSIS — M542 Cervicalgia: Secondary | ICD-10-CM | POA: Diagnosis not present

## 2021-01-22 DIAGNOSIS — G4733 Obstructive sleep apnea (adult) (pediatric): Secondary | ICD-10-CM | POA: Diagnosis not present

## 2021-01-23 DIAGNOSIS — M542 Cervicalgia: Secondary | ICD-10-CM | POA: Diagnosis not present

## 2021-02-21 DIAGNOSIS — G4733 Obstructive sleep apnea (adult) (pediatric): Secondary | ICD-10-CM | POA: Diagnosis not present

## 2021-02-22 DIAGNOSIS — I129 Hypertensive chronic kidney disease with stage 1 through stage 4 chronic kidney disease, or unspecified chronic kidney disease: Secondary | ICD-10-CM | POA: Diagnosis not present

## 2021-02-22 DIAGNOSIS — E7800 Pure hypercholesterolemia, unspecified: Secondary | ICD-10-CM | POA: Diagnosis not present

## 2021-02-22 DIAGNOSIS — N1831 Chronic kidney disease, stage 3a: Secondary | ICD-10-CM | POA: Diagnosis not present

## 2021-02-26 DIAGNOSIS — M542 Cervicalgia: Secondary | ICD-10-CM | POA: Diagnosis not present

## 2021-03-21 DIAGNOSIS — E7800 Pure hypercholesterolemia, unspecified: Secondary | ICD-10-CM | POA: Diagnosis not present

## 2021-03-21 DIAGNOSIS — I129 Hypertensive chronic kidney disease with stage 1 through stage 4 chronic kidney disease, or unspecified chronic kidney disease: Secondary | ICD-10-CM | POA: Diagnosis not present

## 2021-03-21 DIAGNOSIS — N1831 Chronic kidney disease, stage 3a: Secondary | ICD-10-CM | POA: Diagnosis not present
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
## 2021-03-24 DIAGNOSIS — G4733 Obstructive sleep apnea (adult) (pediatric): Secondary | ICD-10-CM | POA: Diagnosis not present

## 2021-03-25 ENCOUNTER — Encounter: Payer: Self-pay | Admitting: Internal Medicine

## 2021-03-25 ENCOUNTER — Ambulatory Visit: Payer: Medicare Other | Admitting: Internal Medicine

## 2021-03-25 ENCOUNTER — Other Ambulatory Visit: Payer: Self-pay

## 2021-03-25 DIAGNOSIS — G4733 Obstructive sleep apnea (adult) (pediatric): Secondary | ICD-10-CM

## 2021-03-25 DIAGNOSIS — R251 Tremor, unspecified: Secondary | ICD-10-CM | POA: Diagnosis not present

## 2021-03-25 NOTE — Assessment & Plan Note (Signed)
Suspect this is benign essential tremor. Managed elsewhere.

## 2021-03-25 NOTE — Patient Instructions (Signed)
We can continue CPAP auto 10-20.  You can also continue your oral appliance as you have benn.  Please call if we can help

## 2021-03-25 NOTE — Assessment & Plan Note (Signed)
Benefits from both CPAP and oral appliance, interchanged mostly related to travel. Plan- continue CPAP auto 10-20

## 2021-04-23 DIAGNOSIS — G4733 Obstructive sleep apnea (adult) (pediatric): Secondary | ICD-10-CM | POA: Diagnosis not present

## 2021-05-14 DIAGNOSIS — N1831 Chronic kidney disease, stage 3a: Secondary | ICD-10-CM | POA: Diagnosis not present

## 2021-05-14 DIAGNOSIS — E7800 Pure hypercholesterolemia, unspecified: Secondary | ICD-10-CM | POA: Diagnosis not present

## 2021-05-14 DIAGNOSIS — I129 Hypertensive chronic kidney disease with stage 1 through stage 4 chronic kidney disease, or unspecified chronic kidney disease: Secondary | ICD-10-CM | POA: Diagnosis not present

## 2021-05-24 DIAGNOSIS — G4733 Obstructive sleep apnea (adult) (pediatric): Secondary | ICD-10-CM | POA: Diagnosis not present

## 2021-06-07 DIAGNOSIS — E7800 Pure hypercholesterolemia, unspecified: Secondary | ICD-10-CM | POA: Diagnosis not present

## 2021-06-07 DIAGNOSIS — N1831 Chronic kidney disease, stage 3a: Secondary | ICD-10-CM | POA: Diagnosis not present

## 2021-06-07 DIAGNOSIS — Z79899 Other long term (current) drug therapy: Secondary | ICD-10-CM | POA: Diagnosis not present

## 2021-06-07 DIAGNOSIS — Z0000 Encounter for general adult medical examination without abnormal findings: Secondary | ICD-10-CM | POA: Diagnosis not present

## 2021-06-07 DIAGNOSIS — G4733 Obstructive sleep apnea (adult) (pediatric): Secondary | ICD-10-CM | POA: Diagnosis not present

## 2021-06-07 DIAGNOSIS — I129 Hypertensive chronic kidney disease with stage 1 through stage 4 chronic kidney disease, or unspecified chronic kidney disease: Secondary | ICD-10-CM | POA: Diagnosis not present

## 2021-06-07 DIAGNOSIS — Z1389 Encounter for screening for other disorder: Secondary | ICD-10-CM | POA: Diagnosis not present

## 2021-06-10 DIAGNOSIS — G4733 Obstructive sleep apnea (adult) (pediatric): Secondary | ICD-10-CM | POA: Diagnosis not present

## 2021-06-19 DIAGNOSIS — C44222 Squamous cell carcinoma of skin of right ear and external auricular canal: Secondary | ICD-10-CM | POA: Diagnosis not present

## 2021-06-19 DIAGNOSIS — D0461 Carcinoma in situ of skin of right upper limb, including shoulder: Secondary | ICD-10-CM | POA: Diagnosis not present

## 2021-06-19 DIAGNOSIS — L821 Other seborrheic keratosis: Secondary | ICD-10-CM | POA: Diagnosis not present

## 2021-06-19 DIAGNOSIS — D692 Other nonthrombocytopenic purpura: Secondary | ICD-10-CM | POA: Diagnosis not present

## 2021-06-19 DIAGNOSIS — Z85828 Personal history of other malignant neoplasm of skin: Secondary | ICD-10-CM | POA: Diagnosis not present

## 2021-06-19 DIAGNOSIS — D485 Neoplasm of uncertain behavior of skin: Secondary | ICD-10-CM | POA: Diagnosis not present

## 2021-06-19 DIAGNOSIS — L570 Actinic keratosis: Secondary | ICD-10-CM | POA: Diagnosis not present

## 2021-06-24 DIAGNOSIS — G4733 Obstructive sleep apnea (adult) (pediatric): Secondary | ICD-10-CM | POA: Diagnosis not present

## 2021-07-09 DIAGNOSIS — G4733 Obstructive sleep apnea (adult) (pediatric): Secondary | ICD-10-CM | POA: Diagnosis not present

## 2021-07-24 DIAGNOSIS — G4733 Obstructive sleep apnea (adult) (pediatric): Secondary | ICD-10-CM | POA: Diagnosis not present

## 2021-08-24 DIAGNOSIS — G4733 Obstructive sleep apnea (adult) (pediatric): Secondary | ICD-10-CM | POA: Diagnosis not present

## 2021-09-23 DIAGNOSIS — G4733 Obstructive sleep apnea (adult) (pediatric): Secondary | ICD-10-CM | POA: Diagnosis not present

## 2021-10-24 DIAGNOSIS — G4733 Obstructive sleep apnea (adult) (pediatric): Secondary | ICD-10-CM | POA: Diagnosis not present

## 2021-11-19 DIAGNOSIS — H5203 Hypermetropia, bilateral: Secondary | ICD-10-CM | POA: Diagnosis not present

## 2021-11-19 DIAGNOSIS — I10 Essential (primary) hypertension: Secondary | ICD-10-CM | POA: Diagnosis not present

## 2021-11-19 DIAGNOSIS — H35033 Hypertensive retinopathy, bilateral: Secondary | ICD-10-CM | POA: Diagnosis not present

## 2021-12-11 DIAGNOSIS — I129 Hypertensive chronic kidney disease with stage 1 through stage 4 chronic kidney disease, or unspecified chronic kidney disease: Secondary | ICD-10-CM | POA: Diagnosis not present

## 2021-12-11 DIAGNOSIS — N1831 Chronic kidney disease, stage 3a: Secondary | ICD-10-CM | POA: Diagnosis not present

## 2021-12-19 DIAGNOSIS — L570 Actinic keratosis: Secondary | ICD-10-CM | POA: Diagnosis not present

## 2021-12-19 DIAGNOSIS — C4441 Basal cell carcinoma of skin of scalp and neck: Secondary | ICD-10-CM | POA: Diagnosis not present

## 2021-12-19 DIAGNOSIS — C44519 Basal cell carcinoma of skin of other part of trunk: Secondary | ICD-10-CM | POA: Diagnosis not present

## 2021-12-19 DIAGNOSIS — D225 Melanocytic nevi of trunk: Secondary | ICD-10-CM | POA: Diagnosis not present

## 2021-12-19 DIAGNOSIS — Z85828 Personal history of other malignant neoplasm of skin: Secondary | ICD-10-CM | POA: Diagnosis not present
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
## 2022-03-25 ENCOUNTER — Ambulatory Visit: Payer: Medicare Other | Admitting: Internal Medicine

## 2022-03-25 ENCOUNTER — Encounter: Payer: Self-pay | Admitting: Internal Medicine

## 2022-03-25 DIAGNOSIS — Z9109 Other allergy status, other than to drugs and biological substances: Secondary | ICD-10-CM

## 2022-03-25 DIAGNOSIS — G4733 Obstructive sleep apnea (adult) (pediatric): Secondary | ICD-10-CM

## 2022-03-25 NOTE — Assessment & Plan Note (Signed)
Mentions this spring pollen season was worse than usual for him.  Season has passed now and symptoms resolved.  Nasal steroid and/or antihistamine would be the usual place to start.

## 2022-03-25 NOTE — Patient Instructions (Signed)
Glad CPAP seems to be working very well. We can continue auto 10-20.  Please call if we can help

## 2022-03-25 NOTE — Assessment & Plan Note (Signed)
He benefits from CPAP, using a fitted oral appliance when away from home.  No changes or concerns. Plan-continue auto-20

## 2022-05-21 DIAGNOSIS — G4733 Obstructive sleep apnea (adult) (pediatric): Secondary | ICD-10-CM | POA: Diagnosis not present

## 2022-06-20 DIAGNOSIS — L821 Other seborrheic keratosis: Secondary | ICD-10-CM | POA: Diagnosis not present

## 2022-06-20 DIAGNOSIS — Z85828 Personal history of other malignant neoplasm of skin: Secondary | ICD-10-CM | POA: Diagnosis not present

## 2022-06-20 DIAGNOSIS — L812 Freckles: Secondary | ICD-10-CM | POA: Diagnosis not present

## 2022-06-20 DIAGNOSIS — L570 Actinic keratosis: Secondary | ICD-10-CM | POA: Diagnosis not present

## 2022-06-20 DIAGNOSIS — D692 Other nonthrombocytopenic purpura: Secondary | ICD-10-CM | POA: Diagnosis not present

## 2022-06-20 DIAGNOSIS — C44219 Basal cell carcinoma of skin of left ear and external auricular canal: Secondary | ICD-10-CM | POA: Diagnosis not present

## 2022-06-20 DIAGNOSIS — D2261 Melanocytic nevi of right upper limb, including shoulder: Secondary | ICD-10-CM | POA: Diagnosis not present

## 2022-06-20 DIAGNOSIS — D2262 Melanocytic nevi of left upper limb, including shoulder: Secondary | ICD-10-CM | POA: Diagnosis not present

## 2022-06-20 DIAGNOSIS — D225 Melanocytic nevi of trunk: Secondary | ICD-10-CM | POA: Diagnosis not present

## 2022-06-25 DIAGNOSIS — Z79899 Other long term (current) drug therapy: Secondary | ICD-10-CM | POA: Diagnosis not present

## 2022-06-25 DIAGNOSIS — N1831 Chronic kidney disease, stage 3a: Secondary | ICD-10-CM | POA: Diagnosis not present

## 2022-06-25 DIAGNOSIS — H6121 Impacted cerumen, right ear: Secondary | ICD-10-CM | POA: Diagnosis not present

## 2022-06-25 DIAGNOSIS — I129 Hypertensive chronic kidney disease with stage 1 through stage 4 chronic kidney disease, or unspecified chronic kidney disease: Secondary | ICD-10-CM | POA: Diagnosis not present

## 2022-06-25 DIAGNOSIS — Z23 Encounter for immunization: Secondary | ICD-10-CM | POA: Diagnosis not present

## 2022-06-25 DIAGNOSIS — G4733 Obstructive sleep apnea (adult) (pediatric): Secondary | ICD-10-CM | POA: Diagnosis not present

## 2022-06-25 DIAGNOSIS — E7800 Pure hypercholesterolemia, unspecified: Secondary | ICD-10-CM | POA: Diagnosis not present

## 2022-06-25 DIAGNOSIS — J301 Allergic rhinitis due to pollen: Secondary | ICD-10-CM | POA: Diagnosis not present

## 2022-06-25 DIAGNOSIS — Z0000 Encounter for general adult medical examination without abnormal findings: Secondary | ICD-10-CM | POA: Diagnosis not present

## 2022-07-04 DIAGNOSIS — H6121 Impacted cerumen, right ear: Secondary | ICD-10-CM | POA: Diagnosis not present

## 2022-07-28 DIAGNOSIS — C44219 Basal cell carcinoma of skin of left ear and external auricular canal: Secondary | ICD-10-CM | POA: Diagnosis not present

## 2022-11-13 DIAGNOSIS — H5203 Hypermetropia, bilateral: Secondary | ICD-10-CM | POA: Diagnosis not present

## 2022-11-27 DIAGNOSIS — F419 Anxiety disorder, unspecified: Secondary | ICD-10-CM | POA: Diagnosis not present

## 2022-11-27 DIAGNOSIS — T148XXA Other injury of unspecified body region, initial encounter: Secondary | ICD-10-CM | POA: Diagnosis not present

## 2022-11-27 DIAGNOSIS — R29898 Other symptoms and signs involving the musculoskeletal system: Secondary | ICD-10-CM | POA: Diagnosis not present

## 2022-11-27 DIAGNOSIS — G8929 Other chronic pain: Secondary | ICD-10-CM | POA: Diagnosis not present

## 2022-11-27 DIAGNOSIS — M545 Low back pain, unspecified: Secondary | ICD-10-CM | POA: Diagnosis not present

## 2022-12-22 DIAGNOSIS — L821 Other seborrheic keratosis: Secondary | ICD-10-CM | POA: Diagnosis not present

## 2022-12-22 DIAGNOSIS — B353 Tinea pedis: Secondary | ICD-10-CM | POA: Diagnosis not present

## 2022-12-22 DIAGNOSIS — Z85828 Personal history of other malignant neoplasm of skin: Secondary | ICD-10-CM | POA: Diagnosis not present

## 2022-12-22 DIAGNOSIS — L570 Actinic keratosis: Secondary | ICD-10-CM | POA: Diagnosis not present

## 2022-12-22 DIAGNOSIS — L820 Inflamed seborrheic keratosis: Secondary | ICD-10-CM | POA: Diagnosis not present

## 2022-12-22 DIAGNOSIS — L98499 Non-pressure chronic ulcer of skin of other sites with unspecified severity: Secondary | ICD-10-CM | POA: Diagnosis not present

## 2022-12-22 DIAGNOSIS — D225 Melanocytic nevi of trunk: Secondary | ICD-10-CM | POA: Diagnosis not present
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
## 2023-03-26 ENCOUNTER — Ambulatory Visit: Payer: Medicare HMO | Admitting: Internal Medicine

## 2023-03-26 ENCOUNTER — Encounter: Payer: Self-pay | Admitting: Internal Medicine

## 2023-03-26 VITALS — BP 118/62 | HR 55 | Ht 70.0 in | Wt 194.6 lb

## 2023-03-26 DIAGNOSIS — R251 Tremor, unspecified: Secondary | ICD-10-CM | POA: Diagnosis not present

## 2023-03-26 DIAGNOSIS — G4733 Obstructive sleep apnea (adult) (pediatric): Secondary | ICD-10-CM | POA: Diagnosis not present

## 2023-03-26 NOTE — Assessment & Plan Note (Signed)
Benefits from CPAP with good compliance and control. Plan-continue auto 10-20.  Adjust humidifier.  Discussed servicing machine and refitting mask with [employer] as needed.

## 2023-03-26 NOTE — Assessment & Plan Note (Signed)
Less evident during evaluation at this visit.

## 2023-03-26 NOTE — Patient Instructions (Signed)
We can continue CPAP auto 10-20  You can explore adjusting the humidifier for dry mouth.  If the hissing noise is bothersome, ask [employer] about servicing your machine or refitting your mask.

## 2023-06-25 DIAGNOSIS — C44519 Basal cell carcinoma of skin of other part of trunk: Secondary | ICD-10-CM | POA: Diagnosis not present

## 2023-06-25 DIAGNOSIS — C44612 Basal cell carcinoma of skin of right upper limb, including shoulder: Secondary | ICD-10-CM | POA: Diagnosis not present

## 2023-06-25 DIAGNOSIS — C44229 Squamous cell carcinoma of skin of left ear and external auricular canal: Secondary | ICD-10-CM | POA: Diagnosis not present

## 2023-06-25 DIAGNOSIS — D485 Neoplasm of uncertain behavior of skin: Secondary | ICD-10-CM | POA: Diagnosis not present

## 2023-06-25 DIAGNOSIS — Z85828 Personal history of other malignant neoplasm of skin: Secondary | ICD-10-CM | POA: Diagnosis not present

## 2023-06-25 DIAGNOSIS — C44719 Basal cell carcinoma of skin of left lower limb, including hip: Secondary | ICD-10-CM | POA: Diagnosis not present

## 2023-06-25 DIAGNOSIS — L570 Actinic keratosis: Secondary | ICD-10-CM | POA: Diagnosis not present

## 2023-06-25 DIAGNOSIS — L821 Other seborrheic keratosis: Secondary | ICD-10-CM | POA: Diagnosis not present

## 2023-06-25 DIAGNOSIS — B353 Tinea pedis: Secondary | ICD-10-CM | POA: Diagnosis not present

## 2023-06-25 DIAGNOSIS — L720 Epidermal cyst: Secondary | ICD-10-CM | POA: Diagnosis not present

## 2023-06-25 DIAGNOSIS — C44329 Squamous cell carcinoma of skin of other parts of face: Secondary | ICD-10-CM | POA: Diagnosis not present

## 2023-07-02 DIAGNOSIS — Z23 Encounter for immunization: Secondary | ICD-10-CM | POA: Diagnosis not present

## 2023-07-02 DIAGNOSIS — N1831 Chronic kidney disease, stage 3a: Secondary | ICD-10-CM | POA: Diagnosis not present

## 2023-07-02 DIAGNOSIS — F419 Anxiety disorder, unspecified: Secondary | ICD-10-CM | POA: Diagnosis not present

## 2023-07-02 DIAGNOSIS — I129 Hypertensive chronic kidney disease with stage 1 through stage 4 chronic kidney disease, or unspecified chronic kidney disease: Secondary | ICD-10-CM | POA: Diagnosis not present

## 2023-07-02 DIAGNOSIS — G4733 Obstructive sleep apnea (adult) (pediatric): Secondary | ICD-10-CM | POA: Diagnosis not present

## 2023-07-02 DIAGNOSIS — Z79899 Other long term (current) drug therapy: Secondary | ICD-10-CM | POA: Diagnosis not present

## 2023-07-02 DIAGNOSIS — E7800 Pure hypercholesterolemia, unspecified: Secondary | ICD-10-CM | POA: Diagnosis not present

## 2023-07-02 DIAGNOSIS — Z1331 Encounter for screening for depression: Secondary | ICD-10-CM | POA: Diagnosis not present

## 2023-07-02 DIAGNOSIS — Z0000 Encounter for general adult medical examination without abnormal findings: Secondary | ICD-10-CM | POA: Diagnosis not present

## 2023-07-02 DIAGNOSIS — J301 Allergic rhinitis due to pollen: Secondary | ICD-10-CM | POA: Diagnosis not present

## 2023-07-02 DIAGNOSIS — G250 Essential tremor: Secondary | ICD-10-CM | POA: Diagnosis not present

## 2023-07-03 ENCOUNTER — Ambulatory Visit: Payer: Medicare HMO | Attending: Internal Medicine | Admitting: Audiologist

## 2023-07-03 DIAGNOSIS — H903 Sensorineural hearing loss, bilateral: Secondary | ICD-10-CM | POA: Insufficient documentation

## 2023-07-03 NOTE — Procedures (Signed)
Outpatient Audiology and [facility] [address] [place], [place]  [address] [phone]  AUDIOLOGICAL  EVALUATION  NAME: Jeremy Finley     DOB:   11-29-1934      MRN: [id number]                                                                                     DATE: 07/03/2023     REFERENT: Merlene Laughter, MD (Inactive) STATUS: Outpatient DIAGNOSIS:  Sensorineural hearing loss bilateral   History: Jeremy Finley was seen for an audiological evaluation. Jeremy Finley has hearing aids from Dr. Izola Price at [facility].  He was referred to [facility] for an updated hearing test in order to have his hearing aids reprogrammed.  Release of information signed for audiogram to be sent to his hearing aid provider.  He feels his hearing may have changed a little bit since his last test 2 years ago.  He is missing some words on the TV now.  Overall he hears pretty well.  His main issue is hearing his wife.  He has had no significant changes in his medical history since his last hearing test.  Evaluation:  Otoscopy showed a clear view of the tympanic membranes, bilaterally Tympanometry results were consistent with normal middle ear function bilaterally Audiometric testing was completed using conventional audiometry with supra aural transducer. Speech Recognition Thresholds were 15 dB in the right ear and 15 dB in the left ear. Word Recognition was performed 40 dB SL, scored 96% in the right ear and 96% in the left ear. Pure tone thresholds show normal hearing sloping after 3 kHz to a moderately severe sensorineural hearing loss bilaterally.   Results:  The test results were reviewed with Jeremy Finley.  He feels that his audiogram shows a slight decrease in hearing compared to his last test.  Results faxed to Dr. Izola Price.  Paper copy provided to Jeremy Finley.   Recommendations: 1.   Follow up with fitting audiologist for hearing aid programming.  No need to follow-up with [facility] at this time.    20 minutes spent testing and counseling on results.   Ammie Ferrier  Audiologist, Au.D., CCC-A 07/03/2023  10:26 AM  Cc: Merlene Laughter, MD (Inactive)

## 2023-07-09 ENCOUNTER — Telehealth: Payer: Self-pay | Admitting: Internal Medicine

## 2023-07-09 NOTE — Telephone Encounter (Signed)
PT calling because [name] told him to bring in his CPAP chip/Card. He states in the past we were able to read his sleep stats by the internet. Please call to advise what is the correct answer. [phone]

## 2023-07-10 NOTE — Telephone Encounter (Signed)
Patient checking on message for CPAP card. Patient phone number is [phone].

## 2023-07-14 NOTE — Telephone Encounter (Signed)
ATC LVMTCB x 1 ?

## 2023-07-15 NOTE — Telephone Encounter (Signed)
Internet access may have expired. No problem for now. Please ask him for next appointment, next June- please bring SD card from machine with him. If he doesn't have one- ask him to request one from his DME company.

## 2023-07-15 NOTE — Telephone Encounter (Signed)
Dr Maple Hudson patient was seen 03/26/23.It appears CPAP report was downloaded via Airview. Patient says [name] left him a message stating he needed to bring his chip by. Were there any issues with download?

## 2023-07-15 NOTE — Telephone Encounter (Signed)
LVM of [facility] response to SD card for CPAP machine.

## 2023-07-21 DIAGNOSIS — C44229 Squamous cell carcinoma of skin of left ear and external auricular canal: Secondary | ICD-10-CM | POA: Diagnosis not present

## 2023-07-21 DIAGNOSIS — Z85828 Personal history of other malignant neoplasm of skin: Secondary | ICD-10-CM | POA: Diagnosis not present

## 2023-10-12 ENCOUNTER — Telehealth: Payer: Self-pay | Admitting: Internal Medicine

## 2023-10-12 DIAGNOSIS — G4733 Obstructive sleep apnea (adult) (pediatric): Secondary | ICD-10-CM

## 2023-10-12 NOTE — Telephone Encounter (Signed)
Patient states needs chip for CPAP machine. Patient uses [employer]. Patient phone number is [phone].

## 2023-10-21 NOTE — Telephone Encounter (Signed)
 I called the pt and there was no answer- no option to leave msg. Will call back.

## 2023-10-26 NOTE — Telephone Encounter (Signed)
 Called and spoke with patients wife.  She said patient was not home today, but would be home all day Tuesday 10/26/23.

## 2023-10-28 DIAGNOSIS — C44729 Squamous cell carcinoma of skin of left lower limb, including hip: Secondary | ICD-10-CM | POA: Diagnosis not present

## 2023-10-28 DIAGNOSIS — D485 Neoplasm of uncertain behavior of skin: Secondary | ICD-10-CM | POA: Diagnosis not present

## 2023-10-28 DIAGNOSIS — C44329 Squamous cell carcinoma of skin of other parts of face: Secondary | ICD-10-CM | POA: Diagnosis not present

## 2023-10-28 DIAGNOSIS — L570 Actinic keratosis: Secondary | ICD-10-CM | POA: Diagnosis not present

## 2023-10-28 DIAGNOSIS — L905 Scar conditions and fibrosis of skin: Secondary | ICD-10-CM | POA: Diagnosis not present

## 2023-10-28 DIAGNOSIS — D225 Melanocytic nevi of trunk: Secondary | ICD-10-CM | POA: Diagnosis not present

## 2023-10-28 DIAGNOSIS — Z85828 Personal history of other malignant neoplasm of skin: Secondary | ICD-10-CM | POA: Diagnosis not present

## 2023-10-28 DIAGNOSIS — D0472 Carcinoma in situ of skin of left lower limb, including hip: Secondary | ICD-10-CM | POA: Diagnosis not present

## 2023-10-28 DIAGNOSIS — L821 Other seborrheic keratosis: Secondary | ICD-10-CM | POA: Diagnosis not present

## 2023-11-03 NOTE — Telephone Encounter (Signed)
Patient is returning phone call. Patient phone number is [phone].

## 2023-11-03 NOTE — Telephone Encounter (Signed)
I called and spoke with the pt  He states [employer] needs order for SD card for his CPAP  I have placed referral for this  Nothing further needed

## 2023-11-03 NOTE — Telephone Encounter (Signed)
Called pt no answer, lvmm for pt to give the office a call back

## 2023-11-20 DIAGNOSIS — Z85828 Personal history of other malignant neoplasm of skin: Secondary | ICD-10-CM | POA: Diagnosis not present

## 2023-11-20 DIAGNOSIS — C44329 Squamous cell carcinoma of skin of other parts of face: Secondary | ICD-10-CM | POA: Diagnosis not present

## 2023-11-20 DIAGNOSIS — D0439 Carcinoma in situ of skin of other parts of face: Secondary | ICD-10-CM | POA: Diagnosis not present

## 2023-11-26 DIAGNOSIS — H52223 Regular astigmatism, bilateral: Secondary | ICD-10-CM | POA: Diagnosis not present

## 2024-02-25 DIAGNOSIS — Z85828 Personal history of other malignant neoplasm of skin: Secondary | ICD-10-CM | POA: Diagnosis not present

## 2024-02-25 DIAGNOSIS — L905 Scar conditions and fibrosis of skin: Secondary | ICD-10-CM | POA: Diagnosis not present

## 2024-02-25 DIAGNOSIS — L570 Actinic keratosis: Secondary | ICD-10-CM | POA: Diagnosis not present

## 2024-02-25 DIAGNOSIS — D225 Melanocytic nevi of trunk: Secondary | ICD-10-CM | POA: Diagnosis not present

## 2024-02-25 DIAGNOSIS — D2262 Melanocytic nevi of left upper limb, including shoulder: Secondary | ICD-10-CM | POA: Diagnosis not present

## 2024-02-25 DIAGNOSIS — L821 Other seborrheic keratosis: Secondary | ICD-10-CM | POA: Diagnosis not present

## 2024-02-25 DIAGNOSIS — D2261 Melanocytic nevi of right upper limb, including shoulder: Secondary | ICD-10-CM | POA: Diagnosis not present

## 2024-05-02 DIAGNOSIS — Z0000 Encounter for general adult medical examination without abnormal findings: Secondary | ICD-10-CM | POA: Diagnosis not present

## 2024-05-02 DIAGNOSIS — R944 Abnormal results of kidney function studies: Secondary | ICD-10-CM | POA: Diagnosis not present

## 2024-05-02 DIAGNOSIS — Z6827 Body mass index (BMI) 27.0-27.9, adult: Secondary | ICD-10-CM | POA: Diagnosis not present

## 2024-05-02 DIAGNOSIS — E663 Overweight: Secondary | ICD-10-CM | POA: Diagnosis not present

## 2024-05-02 DIAGNOSIS — I10 Essential (primary) hypertension: Secondary | ICD-10-CM | POA: Diagnosis not present

## 2024-05-02 DIAGNOSIS — Z87891 Personal history of nicotine dependence: Secondary | ICD-10-CM | POA: Diagnosis not present

## 2024-05-02 DIAGNOSIS — G4733 Obstructive sleep apnea (adult) (pediatric): Secondary | ICD-10-CM | POA: Diagnosis not present

## 2024-05-30 DIAGNOSIS — L84 Corns and callosities: Secondary | ICD-10-CM | POA: Diagnosis not present

## 2024-05-30 DIAGNOSIS — B353 Tinea pedis: Secondary | ICD-10-CM | POA: Diagnosis not present

## 2024-05-30 DIAGNOSIS — B351 Tinea unguium: Secondary | ICD-10-CM | POA: Diagnosis not present

## 2024-06-09 ENCOUNTER — Ambulatory Visit (INDEPENDENT_AMBULATORY_CARE_PROVIDER_SITE_OTHER)

## 2024-06-09 ENCOUNTER — Encounter: Payer: Self-pay | Admitting: Podiatry

## 2024-06-09 ENCOUNTER — Ambulatory Visit: Admitting: Podiatry

## 2024-06-09 DIAGNOSIS — D2371 Other benign neoplasm of skin of right lower limb, including hip: Secondary | ICD-10-CM | POA: Diagnosis not present

## 2024-06-09 DIAGNOSIS — M79676 Pain in unspecified toe(s): Secondary | ICD-10-CM | POA: Diagnosis not present

## 2024-06-09 DIAGNOSIS — M898X7 Other specified disorders of bone, ankle and foot: Secondary | ICD-10-CM | POA: Diagnosis not present

## 2024-06-09 DIAGNOSIS — B351 Tinea unguium: Secondary | ICD-10-CM | POA: Diagnosis not present

## 2024-06-09 NOTE — Progress Notes (Signed)
  Subjective:  Patient ID: Jeremy Finley, male    DOB: May 10, 1935,  MRN: [id number] HPI Chief Complaint  Patient presents with   Toe Pain    5th toe right - aching x 5 weeks, callused area rubbing on 4th toe, very tender at times with shoes and walking   New Patient (Initial Visit)    89 y.o. male presents with the above complaint.   ROS: Denies fever chills nausea muscle aches pains calf pain back pain chest pain shortness of breath.  Past Medical History:  Diagnosis Date   Environmental allergies    Hypertension    Sleep apnea    Past Surgical History:  Procedure Laterality Date   HERNIA REPAIR  4 years ago    Current Outpatient Medications:    escitalopram (LEXAPRO) 20 MG tablet, Take 20 mg by mouth daily., Disp: , Rfl:    aMILoride (MIDAMOR) 5 MG tablet, Take 5 mg by mouth daily., Disp: , Rfl:    busPIRone (BUSPAR) 5 MG tablet, 1 tablet Orally Twice a day for 90 days, Disp: , Rfl:    cholecalciferol (VITAMIN D3) 25 MCG (1000 UNIT) tablet, Take 1,000 Units by mouth daily. Vitamin d3 50mg , Disp: , Rfl:    losartan (COZAAR) 100 MG tablet, Take 1 tablet by mouth daily., Disp: , Rfl:    pravastatin (PRAVACHOL) 10 MG tablet, Take 10 mg by mouth daily., Disp: , Rfl:    propranolol (INDERAL) 10 MG tablet, Take 10 mg by mouth daily., Disp: , Rfl:   Allergies  Allergen Reactions   Sulfa Antibiotics Rash   Review of Systems Objective:  There were no vitals filed for this visit.  General: Well developed, nourished, in no acute distress, alert and oriented x3   Dermatological: Skin is warm, dry and supple bilateral. Nails x 10 are well maintained; remaining integument appears unremarkable at this time. There are no open sores, no preulcerative lesions, no rash or signs of infection present.  Hallux nails bilateral are slightly elongated but reasonably well But they are thick yellow and dystrophic.  Does not demonstrate onychomycosis.  Painful benign skin lesion medial aspect  of the fifth digit as well as the plantar aspect of the fifth toe right foot.  Vascular: Dorsalis Pedis artery and Posterior Tibial artery pedal pulses are 2/4 bilateral with immedate capillary fill time. Pedal hair growth present. No varicosities and no lower extremity edema present bilateral.   Neruologic: Grossly intact via light touch bilateral. Vibratory intact via tuning fork bilateral. Protective threshold with Semmes Wienstein monofilament intact to all pedal sites bilateral. Patellar and Achilles deep tendon reflexes 2+ bilateral. No Babinski or clonus noted bilateral.   Musculoskeletal: No gross boney pedal deformities bilateral. No pain, crepitus, or limitation noted with foot and ankle range of motion bilateral. Muscular strength 5/5 in all groups tested bilateral.  Gait: Unassisted, Nonantalgic.    Radiographs:  None taken  Assessment & Plan:   Assessment: Pain in the left secondary to the hallux bilaterally.  Painful fifth digit right and plantar aspect of the fifth digit with benign skin lesions.    Plan: Debrided benign skin lesions right foot debrided hallux nails bilateral.     Annjeanette Sarwar T. Viola, [place]

## 2024-06-16 ENCOUNTER — Emergency Department (HOSPITAL_BASED_OUTPATIENT_CLINIC_OR_DEPARTMENT_OTHER)

## 2024-06-16 ENCOUNTER — Emergency Department (HOSPITAL_BASED_OUTPATIENT_CLINIC_OR_DEPARTMENT_OTHER)
Admission: EM | Admit: 2024-06-16 | Discharge: 2024-06-16 | Disposition: A | Discharge status: Home / Self Care | Attending: Emergency Medicine | Admitting: Emergency Medicine

## 2024-06-16 ENCOUNTER — Other Ambulatory Visit: Payer: Self-pay

## 2024-06-16 DIAGNOSIS — W19XXXA Unspecified fall, initial encounter: Secondary | ICD-10-CM | POA: Insufficient documentation

## 2024-06-16 DIAGNOSIS — I10 Essential (primary) hypertension: Secondary | ICD-10-CM | POA: Diagnosis not present

## 2024-06-16 DIAGNOSIS — R55 Syncope and collapse: Secondary | ICD-10-CM | POA: Insufficient documentation

## 2024-06-16 DIAGNOSIS — K449 Diaphragmatic hernia without obstruction or gangrene: Secondary | ICD-10-CM | POA: Diagnosis not present

## 2024-06-16 DIAGNOSIS — S46811A Strain of other muscles, fascia and tendons at shoulder and upper arm level, right arm, initial encounter: Secondary | ICD-10-CM | POA: Insufficient documentation

## 2024-06-16 DIAGNOSIS — I700 Atherosclerosis of aorta: Secondary | ICD-10-CM | POA: Diagnosis not present

## 2024-06-16 DIAGNOSIS — Z79899 Other long term (current) drug therapy: Secondary | ICD-10-CM | POA: Diagnosis not present

## 2024-06-16 DIAGNOSIS — M549 Dorsalgia, unspecified: Secondary | ICD-10-CM | POA: Diagnosis not present

## 2024-06-16 DIAGNOSIS — S0990XA Unspecified injury of head, initial encounter: Secondary | ICD-10-CM | POA: Diagnosis not present

## 2024-06-16 DIAGNOSIS — I672 Cerebral atherosclerosis: Secondary | ICD-10-CM | POA: Diagnosis not present

## 2024-06-16 DIAGNOSIS — S4991XA Unspecified injury of right shoulder and upper arm, initial encounter: Secondary | ICD-10-CM | POA: Diagnosis present

## 2024-06-16 LAB — COMPREHENSIVE METABOLIC PANEL WITH GFR
ALT: 25 U/L (ref 0–44)
AST: 28 U/L (ref 15–41)
Albumin: 4.3 g/dL (ref 3.5–5.0)
Alkaline Phosphatase: 109 U/L (ref 38–126)
Anion gap: 9 (ref 5–15)
BUN: 33 mg/dL — ABNORMAL HIGH (ref 8–23)
CO2: 24 mmol/L (ref 22–32)
Calcium: 10 mg/dL (ref 8.9–10.3)
Chloride: 105 mmol/L (ref 98–111)
Creatinine, Ser: 1.54 mg/dL — ABNORMAL HIGH (ref 0.61–1.24)
GFR, Estimated: 43 mL/min — ABNORMAL LOW (ref >60)
Glucose, Bld: 102 mg/dL — ABNORMAL HIGH (ref 70–99)
Potassium: 5.1 mmol/L (ref 3.5–5.1)
Sodium: 138 mmol/L (ref 135–145)
Total Bilirubin: 0.9 mg/dL (ref 0.0–1.2)
Total Protein: 7.1 g/dL (ref 6.5–8.1)

## 2024-06-16 LAB — CBC
HCT: 42.7 % (ref 39.0–52.0)
Hemoglobin: 14.5 g/dL (ref 13.0–17.0)
MCH: 32.4 pg (ref 26.0–34.0)
MCHC: 34 g/dL (ref 30.0–36.0)
MCV: 95.3 fL (ref 80.0–100.0)
Platelets: 189 K/uL (ref 150–400)
RBC: 4.48 MIL/uL (ref 4.22–5.81)
RDW: 12.1 % (ref 11.5–15.5)
WBC: 9.3 K/uL (ref 4.0–10.5)
nRBC: 0 % (ref 0.0–0.2)

## 2024-06-16 LAB — URINALYSIS, ROUTINE W REFLEX MICROSCOPIC
Bilirubin Urine: NEGATIVE
Glucose, UA: NEGATIVE mg/dL
Hgb urine dipstick: NEGATIVE
Ketones, ur: NEGATIVE mg/dL
Leukocytes,Ua: NEGATIVE
Nitrite: NEGATIVE
Specific Gravity, Urine: 1.024 (ref 1.005–1.030)
pH: 6 (ref 5.0–8.0)

## 2024-06-16 MED ORDER — LIDOCAINE 5 % EX PTCH: 1.0000 | MEDICATED_PATCH | CUTANEOUS | 0 refills | Status: AC

## 2024-06-16 MED ORDER — ACETAMINOPHEN 500 MG PO TABS
1000.0000 mg | ORAL_TABLET | Freq: Once | ORAL | Status: AC
  Administered 2024-06-16: 1000 mg via ORAL
  Filled 2024-06-16: qty 2

## 2024-06-16 MED ORDER — LIDOCAINE 5 % EX PTCH
1.0000 | MEDICATED_PATCH | CUTANEOUS | Status: DC
  Administered 2024-06-16: 1 via TRANSDERMAL
  Filled 2024-06-16: qty 1

## 2024-06-16 NOTE — ED Provider Notes (Signed)
 [place] EMERGENCY DEPARTMENT AT [facility] Provider Note   CSN: [id number] Arrival date & time: 06/16/24  1112     Patient presents with: Loss of Consciousness   Jeremy Finley is a 89 y.o. male.  With a history of hypertension who presents to the ED for back pain.  The patient was feeling well yesterday in his normal state of health at home.  Yesterday afternoon he stood up quickly to answer ringing telephone.  He was able to get to the phone and answer.  But shortly thereafter had an episode of lightheadedness and syncope.  He fell forward onto both arms.  He was able to wake up and stand under his own power.  He did not seek medical evaluation yesterday but presents today with persistent back pain.  Pain localized over the right upper back.  Denies headaches neck pain chest pain shortness of breath abdominal pain pain in extremities.  No recurrent episodes of syncope today.    Loss of Consciousness      Prior to Admission medications   Medication Sig Start Date End Date Taking? Authorizing Provider  lidocaine  (LIDODERM ) 5 % Place 1 patch onto the skin daily for 14 days. Remove & Discard patch within 12 hours or as directed by MD 06/16/24 06/30/24 Yes Pamella Ozell LABOR, DO  aMILoride (MIDAMOR) 5 MG tablet Take 5 mg by mouth daily. 03/05/23   [provider]  busPIRone (BUSPAR) 5 MG tablet 1 tablet Orally Twice a day for 90 days    [provider]  cholecalciferol (VITAMIN D3) 25 MCG (1000 UNIT) tablet Take 1,000 Units by mouth daily. Vitamin d3 50mg     [provider]  escitalopram (LEXAPRO) 20 MG tablet Take 20 mg by mouth daily. 04/25/24   [provider]  losartan (COZAAR) 100 MG tablet Take 1 tablet by mouth daily. 07/31/11   [provider]  pravastatin (PRAVACHOL) 10 MG tablet Take 10 mg by mouth daily.    [provider]  propranolol (INDERAL) 10 MG tablet Take 10 mg by mouth daily.    [provider]     Allergies: Sulfa antibiotics    Review of Systems  Cardiovascular:  Positive for syncope.    Updated Vital Signs BP 134/77 (BP Location: Right Arm)   Pulse (!) 52   Temp (!) 97.5 F (36.4 C) (Oral)   Resp 17   SpO2 100%   Physical Exam Vitals and nursing note reviewed.  HENT:     Head: Normocephalic and atraumatic.  Eyes:     Pupils: Pupils are equal, round, and reactive to light.  Cardiovascular:     Rate and Rhythm: Normal rate and regular rhythm.  Pulmonary:     Effort: Pulmonary effort is normal.     Breath sounds: Normal breath sounds.  Abdominal:     Palpations: Abdomen is soft.     Tenderness: There is no abdominal tenderness.  Musculoskeletal:     Cervical back: Normal range of motion and neck supple. No tenderness.     Comments: Right upper thoracic paraspinal tenderness with deep palpation of the trapezius No midline tenderness step-off deformity of back 5 out of 5 motor strength bilateral upper and lower extremities with sensation intact light touch throughout  Skin:    General: Skin is warm and dry.  Neurological:     Mental Status: He is alert.  Psychiatric:        Mood and Affect: Mood normal.     (  all labs ordered are listed, but only abnormal results are displayed) Labs Reviewed  COMPREHENSIVE METABOLIC PANEL WITH GFR - Abnormal; Notable for the following components:      Result Value   Glucose, Bld 102 (*)    BUN 33 (*)    Creatinine, Ser 1.54 (*)    GFR, Estimated 43 (*)    All other components within normal limits  URINALYSIS, ROUTINE W REFLEX MICROSCOPIC - Abnormal; Notable for the following components:   Protein, ur TRACE (*)    All other components within normal limits  CBC  CBG MONITORING, ED    EKG: EKG Interpretation Date/Time:  Thursday June 16 2024 11:30:37 EDT Ventricular Rate:  51 PR Interval:  213 QRS Duration:  101 QT Interval:  450 QTC Calculation: 415 R Axis:   88  Text Interpretation: Sinus rhythm  Borderline prolonged PR interval Borderline right axis deviation Confirmed by Pamella Sharper [phone]) on 06/16/2024 1:08:28 PM  Radiology: CT Head Wo Contrast Result Date: 06/16/2024 CLINICAL DATA:  Head trauma, minor (Age >= 65y) Patient states he had syncopal episode yesterday and now has pain in his right upper back. EXAM: CT HEAD WITHOUT CONTRAST TECHNIQUE: Contiguous axial images were obtained from the base of the skull through the vertex without intravenous contrast. RADIATION DOSE REDUCTION: This exam was performed according to the departmental dose-optimization program which includes automated exposure control, adjustment of the mA and/or kV according to patient size and/or use of iterative reconstruction technique. COMPARISON:  None Available. FINDINGS: Brain: No evidence of large-territorial acute infarction. No parenchymal hemorrhage. No mass lesion. No extra-axial collection. No mass effect or midline shift. No hydrocephalus. Basilar cisterns are patent. Vascular: No hyperdense vessel. Atherosclerotic calcifications are present within the cavernous internal carotid and vertebral arteries. Skull: No acute fracture or focal lesion. Sinuses/Orbits: Paranasal sinuses and mastoid air cells are clear. The orbits are unremarkable. Other: None. IMPRESSION: No acute intracranial abnormality. Electronically Signed   By: Morgane  Naveau M.D.   On: 06/16/2024 15:21   CT Thoracic Spine Wo Contrast Result Date: 06/16/2024 CLINICAL DATA:  R upper back pain s/p fall; Neck trauma (Age >= 65y) Patient states he had syncopal episode yesterday and now has pain in his right upper back. EXAM: CT CERVICAL SPINE WITHOUT CONTRAST CT THORACIC SPINE WITHOUT CONTRAST TECHNIQUE: Multidetector CT imaging of the cervical and thoracic spine was performed without contrast. Multiplanar CT image reconstructions were also generated. RADIATION DOSE REDUCTION: This exam was performed according to the departmental dose-optimization program  which includes automated exposure control, adjustment of the mA and/or kV according to patient size and/or use of iterative reconstruction technique. COMPARISON:  None Available. FINDINGS: CT CERVICAL SPINE FINDINGS Alignment: Normal. Skull base and vertebrae: Diffusely decreased bone density. Multilevel severe degenerative change of the spine. No associated severe osseous neural foraminal or central canal stenosis. No acute fracture. No aggressive appearing focal osseous lesion or focal pathologic process. Soft tissues and spinal canal: No prevertebral fluid or swelling. No visible canal hematoma. Upper chest: Unremarkable. Other: Atherosclerotic plaque of the aortic arch and its branches. Coronary artery calcification. Calcified bilateral hilar lymph nodes suggestive of sequelae of prior granulomatous disease. CT THORACIC SPINE FINDINGS Alignment: Normal. Vertebrae: Diffusely decreased bone density. Multilevel moderate anterior osteophyte formation. No acute fracture or focal pathologic process. Paraspinal and other soft tissues: Negative. Disc levels: Maintained. Other: Tiny hiatal hernia. IMPRESSION: No acute displaced fracture or traumatic listhesis of the cervical and thoracic spine. Electronically Signed   By: Morgane  Naveau  M.D.   On: 06/16/2024 15:17   CT Cervical Spine Wo Contrast Result Date: 06/16/2024 CLINICAL DATA:  R upper back pain s/p fall; Neck trauma (Age >= 65y) Patient states he had syncopal episode yesterday and now has pain in his right upper back. EXAM: CT CERVICAL SPINE WITHOUT CONTRAST CT THORACIC SPINE WITHOUT CONTRAST TECHNIQUE: Multidetector CT imaging of the cervical and thoracic spine was performed without contrast. Multiplanar CT image reconstructions were also generated. RADIATION DOSE REDUCTION: This exam was performed according to the departmental dose-optimization program which includes automated exposure control, adjustment of the mA and/or kV according to patient size and/or  use of iterative reconstruction technique. COMPARISON:  None Available. FINDINGS: CT CERVICAL SPINE FINDINGS Alignment: Normal. Skull base and vertebrae: Diffusely decreased bone density. Multilevel severe degenerative change of the spine. No associated severe osseous neural foraminal or central canal stenosis. No acute fracture. No aggressive appearing focal osseous lesion or focal pathologic process. Soft tissues and spinal canal: No prevertebral fluid or swelling. No visible canal hematoma. Upper chest: Unremarkable. Other: Atherosclerotic plaque of the aortic arch and its branches. Coronary artery calcification. Calcified bilateral hilar lymph nodes suggestive of sequelae of prior granulomatous disease. CT THORACIC SPINE FINDINGS Alignment: Normal. Vertebrae: Diffusely decreased bone density. Multilevel moderate anterior osteophyte formation. No acute fracture or focal pathologic process. Paraspinal and other soft tissues: Negative. Disc levels: Maintained. Other: Tiny hiatal hernia. IMPRESSION: No acute displaced fracture or traumatic listhesis of the cervical and thoracic spine. Electronically Signed   By: Morgane  Naveau M.D.   On: 06/16/2024 15:17     Procedures   Medications Ordered in the ED  lidocaine  (LIDODERM ) 5 % 1 patch (has no administration in time range)  acetaminophen  (TYLENOL ) tablet 1,000 mg (has no administration in time range)    Clinical Course as of 06/16/24 1547  Thu Jun 16, 2024  1544 Creatinine slightly elevated from baseline but baseline Comparison is 15 years ago.  CBC UA unremarkable.  CT head C-spine T-spine without evidence of acute traumatic findings.  Counseled patient on symptomatic management of musculoskeletal strain with ibuprofen, Tylenol  and lidocaine  patches.  Will call in a prescription for lidocaine  patches and instruct for close PCP follow-up.  Return precautions will be worrisome for recurrent episodes of syncope or severe pain were discussed in detail with  him and his son at bedside. [MP]    Clinical Course User Index [MP] Pamella Ozell LABOR, DO                                 Medical Decision Making 89 year old male with history as above presenting for back pain after syncopal episode with collapse yesterday.  Stood up quickly and had what sounds like vasovagal episode and fell forward.  Now with right thoracic back pain.  Regarding the syncope, this is most likely a vasovagal episode based on the story but will obtain EKG to look for evidence of dysrhythmia and laboratory workup to look for anemia, cytosis or significant metabolic abnormality that may be underlying cause of the syncope.  Will obtain CT head C-spine thoracic spine to look for traumatic injury although with point tenderness over the trapezius is most likely muscle strain.  Amount and/or Complexity of Data Reviewed Labs: ordered. Radiology: ordered.  Risk OTC drugs. Prescription drug management.        Final diagnoses:  Vasovagal syncope  Strain of right trapezius muscle, initial encounter  ED Discharge Orders          Ordered    lidocaine  (LIDODERM ) 5 %  Every 24 hours        06/16/24 1547               Pamella Ozell LABOR, DO 06/16/24 1547

## 2024-06-16 NOTE — ED Triage Notes (Signed)
 Patient states he had syncopal episode yesterday and now has pain in his right upper back.

## 2024-06-16 NOTE — Discharge Instructions (Signed)
 You were seen in the emergency department for right upper back pain after an episode of fainting yesterday Your blood work showed slightly elevated kidney function but no other concerning findings Your EKG looked okay The CAT scan of your head C-spine and thoracic spine did not show any broken bones or traumatic findings This is most likely a pulled muscle You can take Tylenol  or Motrin as discussed for pain management You can apply lidocaine  patches We have called in prescription for lidocaine  patches to your pharmacy for you to pick up and use as directed Follow-up with your primary care doctor within 1 week for reevaluation Return to the emergency department for recurrent episodes of fainting or severe pain.

## 2024-07-05 DIAGNOSIS — G4733 Obstructive sleep apnea (adult) (pediatric): Secondary | ICD-10-CM | POA: Diagnosis not present

## 2024-07-05 DIAGNOSIS — N1831 Chronic kidney disease, stage 3a: Secondary | ICD-10-CM | POA: Diagnosis not present

## 2024-07-05 DIAGNOSIS — L84 Corns and callosities: Secondary | ICD-10-CM | POA: Diagnosis not present

## 2024-07-05 DIAGNOSIS — E7800 Pure hypercholesterolemia, unspecified: Secondary | ICD-10-CM | POA: Diagnosis not present

## 2024-07-05 DIAGNOSIS — R55 Syncope and collapse: Secondary | ICD-10-CM | POA: Diagnosis not present

## 2024-07-05 DIAGNOSIS — Z0000 Encounter for general adult medical examination without abnormal findings: Secondary | ICD-10-CM | POA: Diagnosis not present

## 2024-07-05 DIAGNOSIS — Z79899 Other long term (current) drug therapy: Secondary | ICD-10-CM | POA: Diagnosis not present

## 2024-07-05 DIAGNOSIS — Z1331 Encounter for screening for depression: Secondary | ICD-10-CM | POA: Diagnosis not present

## 2024-07-05 DIAGNOSIS — F419 Anxiety disorder, unspecified: Secondary | ICD-10-CM | POA: Diagnosis not present

## 2024-07-05 DIAGNOSIS — G250 Essential tremor: Secondary | ICD-10-CM | POA: Diagnosis not present

## 2024-07-05 DIAGNOSIS — I129 Hypertensive chronic kidney disease with stage 1 through stage 4 chronic kidney disease, or unspecified chronic kidney disease: Secondary | ICD-10-CM | POA: Diagnosis not present

## 2024-08-31 DIAGNOSIS — C44712 Basal cell carcinoma of skin of right lower limb, including hip: Secondary | ICD-10-CM | POA: Diagnosis not present

## 2024-08-31 DIAGNOSIS — L570 Actinic keratosis: Secondary | ICD-10-CM | POA: Diagnosis not present

## 2024-08-31 DIAGNOSIS — C44319 Basal cell carcinoma of skin of other parts of face: Secondary | ICD-10-CM | POA: Diagnosis not present

## 2024-08-31 DIAGNOSIS — D225 Melanocytic nevi of trunk: Secondary | ICD-10-CM | POA: Diagnosis not present

## 2024-08-31 DIAGNOSIS — C44519 Basal cell carcinoma of skin of other part of trunk: Secondary | ICD-10-CM | POA: Diagnosis not present

## 2024-08-31 DIAGNOSIS — D485 Neoplasm of uncertain behavior of skin: Secondary | ICD-10-CM | POA: Diagnosis not present

## 2024-08-31 DIAGNOSIS — L814 Other melanin hyperpigmentation: Secondary | ICD-10-CM | POA: Diagnosis not present

## 2024-08-31 DIAGNOSIS — D045 Carcinoma in situ of skin of trunk: Secondary | ICD-10-CM | POA: Diagnosis not present

## 2024-08-31 DIAGNOSIS — L821 Other seborrheic keratosis: Secondary | ICD-10-CM | POA: Diagnosis not present

## 2024-09-12 ENCOUNTER — Ambulatory Visit: Admitting: Podiatry

## 2024-09-12 ENCOUNTER — Encounter: Payer: Self-pay | Admitting: Podiatry

## 2024-09-12 DIAGNOSIS — M79676 Pain in unspecified toe(s): Secondary | ICD-10-CM | POA: Diagnosis not present

## 2024-09-12 DIAGNOSIS — B351 Tinea unguium: Secondary | ICD-10-CM

## 2024-09-12 NOTE — Progress Notes (Addendum)
 This patient presents to the office with chief complaint of long thick painful nails.  Patient says the nails are painful walking and wearing shoes.  This patient is unable to self treat.  This patient is unable to trim her nails since she is unable to reach her nails.  She presents to the office for preventative foot care services.  General Appearance  Alert, conversant and in no acute stress.  Vascular  Dorsalis pedis and posterior tibial  pulses are  weakly/absent bilaterally.  Capillary return is within normal limits  bilaterally. Temperature is within normal limits  bilaterally.  Neurologic  Senn-Weinstein monofilament wire test within normal limits  bilaterally. Muscle power within normal limits bilaterally.  Nails Thick disfigured discolored nails with subungual debris  from hallux to fifth toes bilaterally. No evidence of bacterial infection or drainage bilaterally.  Orthopedic  No limitations of motion  feet .  No crepitus or effusions noted.  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  Skin  normotropic skin with no porokeratosis noted bilaterally.  No signs of infections or ulcers noted.     Onychomycosis  Nails  B/L.  Pain in right toes  Pain in left toes  Debridement of nails both feet followed trimming the nails with dremel tool.    RTC 4 months.   Cordella Bold DPM

## 2024-11-08 ENCOUNTER — Encounter (HOSPITAL_COMMUNITY): Admission: EM | Disposition: A | Payer: Self-pay | Source: Home / Self Care | Attending: Internal Medicine

## 2024-11-08 ENCOUNTER — Emergency Department (HOSPITAL_COMMUNITY)

## 2024-11-08 ENCOUNTER — Inpatient Hospital Stay (HOSPITAL_COMMUNITY)
Admission: EM | Admit: 2024-11-08 | Discharge: 2024-11-10 | DRG: 322 | Disposition: A | Discharge status: Home / Self Care | Attending: Internal Medicine | Admitting: Internal Medicine

## 2024-11-08 ENCOUNTER — Encounter (HOSPITAL_COMMUNITY): Payer: Self-pay | Admitting: Internal Medicine

## 2024-11-08 DIAGNOSIS — Z955 Presence of coronary angioplasty implant and graft: Secondary | ICD-10-CM

## 2024-11-08 DIAGNOSIS — I10 Essential (primary) hypertension: Secondary | ICD-10-CM | POA: Diagnosis present

## 2024-11-08 DIAGNOSIS — G4733 Obstructive sleep apnea (adult) (pediatric): Secondary | ICD-10-CM | POA: Diagnosis present

## 2024-11-08 DIAGNOSIS — Z87891 Personal history of nicotine dependence: Secondary | ICD-10-CM

## 2024-11-08 DIAGNOSIS — I160 Hypertensive urgency: Secondary | ICD-10-CM | POA: Diagnosis present

## 2024-11-08 DIAGNOSIS — R030 Elevated blood-pressure reading, without diagnosis of hypertension: Secondary | ICD-10-CM

## 2024-11-08 DIAGNOSIS — R7401 Elevation of levels of liver transaminase levels: Secondary | ICD-10-CM | POA: Diagnosis not present

## 2024-11-08 DIAGNOSIS — I214 Non-ST elevation (NSTEMI) myocardial infarction: Principal | ICD-10-CM | POA: Diagnosis present

## 2024-11-08 DIAGNOSIS — E7800 Pure hypercholesterolemia, unspecified: Secondary | ICD-10-CM | POA: Diagnosis present

## 2024-11-08 DIAGNOSIS — I2510 Atherosclerotic heart disease of native coronary artery without angina pectoris: Secondary | ICD-10-CM | POA: Diagnosis present

## 2024-11-08 DIAGNOSIS — I249 Acute ischemic heart disease, unspecified: Secondary | ICD-10-CM | POA: Diagnosis present

## 2024-11-08 DIAGNOSIS — R001 Bradycardia, unspecified: Secondary | ICD-10-CM | POA: Diagnosis present

## 2024-11-08 DIAGNOSIS — Z8249 Family history of ischemic heart disease and other diseases of the circulatory system: Secondary | ICD-10-CM

## 2024-11-08 DIAGNOSIS — Z79899 Other long term (current) drug therapy: Secondary | ICD-10-CM

## 2024-11-08 DIAGNOSIS — Z882 Allergy status to sulfonamides status: Secondary | ICD-10-CM

## 2024-11-08 DIAGNOSIS — N1832 Chronic kidney disease, stage 3b: Secondary | ICD-10-CM | POA: Diagnosis present

## 2024-11-08 DIAGNOSIS — I129 Hypertensive chronic kidney disease with stage 1 through stage 4 chronic kidney disease, or unspecified chronic kidney disease: Secondary | ICD-10-CM | POA: Diagnosis present

## 2024-11-08 DIAGNOSIS — R079 Chest pain, unspecified: Secondary | ICD-10-CM

## 2024-11-08 DIAGNOSIS — Z66 Do not resuscitate: Secondary | ICD-10-CM | POA: Diagnosis present

## 2024-11-08 LAB — CBC WITH DIFFERENTIAL/PLATELET
Abs Immature Granulocytes: 0.03 10*3/uL (ref 0.00–0.07)
Basophils Absolute: 0 10*3/uL (ref 0.0–0.1)
Basophils Relative: 1 %
Eosinophils Absolute: 0.3 10*3/uL (ref 0.0–0.5)
Eosinophils Relative: 3 %
HCT: 43.7 % (ref 39.0–52.0)
Hemoglobin: 14.7 g/dL (ref 13.0–17.0)
Immature Granulocytes: 0 %
Lymphocytes Relative: 19 %
Lymphs Abs: 1.6 10*3/uL (ref 0.7–4.0)
MCH: 32.2 pg (ref 26.0–34.0)
MCHC: 33.6 g/dL (ref 30.0–36.0)
MCV: 95.8 fL (ref 80.0–100.0)
Monocytes Absolute: 0.9 10*3/uL (ref 0.1–1.0)
Monocytes Relative: 10 %
Neutro Abs: 5.6 10*3/uL (ref 1.7–7.7)
Neutrophils Relative %: 67 %
Platelets: 165 10*3/uL (ref 150–400)
RBC: 4.56 MIL/uL (ref 4.22–5.81)
RDW: 12.1 % (ref 11.5–15.5)
WBC: 8.4 10*3/uL (ref 4.0–10.5)
nRBC: 0 % (ref 0.0–0.2)

## 2024-11-08 LAB — COMPREHENSIVE METABOLIC PANEL WITH GFR
ALT: 18 U/L (ref 0–44)
AST: 51 U/L — ABNORMAL HIGH (ref 15–41)
Albumin: 3.7 g/dL (ref 3.5–5.0)
Alkaline Phosphatase: 95 U/L (ref 38–126)
Anion gap: 7 (ref 5–15)
BUN: 24 mg/dL — ABNORMAL HIGH (ref 8–23)
CO2: 28 mmol/L (ref 22–32)
Calcium: 8.9 mg/dL (ref 8.9–10.3)
Chloride: 103 mmol/L (ref 98–111)
Creatinine, Ser: 1.23 mg/dL (ref 0.61–1.24)
GFR, Estimated: 57 mL/min — ABNORMAL LOW
Glucose, Bld: 110 mg/dL — ABNORMAL HIGH (ref 70–99)
Potassium: 4.2 mmol/L (ref 3.5–5.1)
Sodium: 138 mmol/L (ref 135–145)
Total Bilirubin: 0.6 mg/dL (ref 0.0–1.2)
Total Protein: 6.4 g/dL — ABNORMAL LOW (ref 6.5–8.1)

## 2024-11-08 LAB — I-STAT CHEM 8, ED
BUN: 26 mg/dL — ABNORMAL HIGH (ref 8–23)
Calcium, Ion: 1.22 mmol/L (ref 1.15–1.40)
Chloride: 104 mmol/L (ref 98–111)
Creatinine, Ser: 1.3 mg/dL — ABNORMAL HIGH (ref 0.61–1.24)
Glucose, Bld: 105 mg/dL — ABNORMAL HIGH (ref 70–99)
HCT: 42 % (ref 39.0–52.0)
Hemoglobin: 14.3 g/dL (ref 13.0–17.0)
Potassium: 4 mmol/L (ref 3.5–5.1)
Sodium: 141 mmol/L (ref 135–145)
TCO2: 29 mmol/L (ref 22–32)

## 2024-11-08 LAB — LIPID PANEL
Cholesterol: 152 mg/dL (ref 0–200)
HDL: 40 mg/dL — ABNORMAL LOW
LDL Cholesterol: 82 mg/dL (ref 0–99)
Total CHOL/HDL Ratio: 3.8 ratio
Triglycerides: 151 mg/dL — ABNORMAL HIGH
VLDL: 30 mg/dL (ref 0–40)

## 2024-11-08 LAB — TSH: TSH: 3.31 u[IU]/mL (ref 0.350–4.500)

## 2024-11-08 LAB — LIPASE, BLOOD: Lipase: 34 U/L (ref 11–51)

## 2024-11-08 LAB — PRO BRAIN NATRIURETIC PEPTIDE: Pro Brain Natriuretic Peptide: 1719 pg/mL — ABNORMAL HIGH

## 2024-11-08 LAB — TROPONIN T, HIGH SENSITIVITY
Troponin T High Sensitivity: 258 ng/L (ref 0–19)
Troponin T High Sensitivity: 515 ng/L (ref 0–19)

## 2024-11-08 LAB — PROTIME-INR
INR: 1 (ref 0.8–1.2)
Prothrombin Time: 13.7 s (ref 11.4–15.2)

## 2024-11-08 MED ORDER — IOHEXOL 350 MG/ML SOLN
85.0000 mL | Freq: Once | INTRAVENOUS | Status: AC | PRN
  Administered 2024-11-08: 85 mL via INTRAVENOUS

## 2024-11-08 MED ORDER — HEPARIN BOLUS VIA INFUSION
4000.0000 [IU] | Freq: Once | INTRAVENOUS | Status: AC
  Administered 2024-11-08: 4000 [IU] via INTRAVENOUS
  Filled 2024-11-08: qty 4000

## 2024-11-08 MED ORDER — HEPARIN (PORCINE) 25000 UT/250ML-% IV SOLN
1000.0000 [IU]/h | INTRAVENOUS | Status: DC
  Administered 2024-11-08: 1000 [IU]/h via INTRAVENOUS
  Filled 2024-11-08: qty 250

## 2024-11-08 MED ORDER — HYDRALAZINE HCL 20 MG/ML IJ SOLN
10.0000 mg | Freq: Once | INTRAMUSCULAR | Status: AC
  Administered 2024-11-08: 10 mg via INTRAVENOUS
  Filled 2024-11-08: qty 1

## 2024-11-08 MED ORDER — ASPIRIN 325 MG PO TABS
325.0000 mg | ORAL_TABLET | Freq: Once | ORAL | Status: AC
  Administered 2024-11-08: 325 mg via ORAL
  Filled 2024-11-08: qty 1

## 2024-11-08 NOTE — ED Notes (Signed)
 Called and placed PT on monitor with CCMD

## 2024-11-08 NOTE — ED Notes (Signed)
 Troponin 258 reported to Chirstopher Tegeler MD Read back

## 2024-11-08 NOTE — Progress Notes (Signed)
 PHARMACY - ANTICOAGULATION CONSULT NOTE  Pharmacy Consult for IV heparin  Indication: chest pain/ACS  Allergies[1]  Patient Measurements:    Vital Signs: Temp: 97.7 F (36.5 C) (01/27 1830) Temp Source: Oral (01/27 1830) BP: 168/82 (01/27 2100) Pulse Rate: 61 (01/27 2145)  Labs: Recent Labs    11/08/24 1829 11/08/24 1857  HGB 14.7 14.3  HCT 43.7 42.0  PLT 165  --   LABPROT 13.7  --   INR 1.0  --   CREATININE 1.23 1.30*    CrCl cannot be calculated (Unknown ideal weight.).   Medical History: Past Medical History:  Diagnosis Date   Environmental allergies    Hypertension    Sleep apnea     Medications:  (Not in a hospital admission)   Assessment: 89 yo M presenting with CP. No on AC PTA. Pharmacy consulted to dose heparin .   Baseline CBC stable.   Goal of Therapy:  Heparin  level 0.3-0.7 units/ml Monitor platelets by anticoagulation protocol: Yes   Plan:  Give 4000 units bolus x 1 Start heparin  infusion at 1000 units/hr Check anti-Xa level in 8 hours and daily while on heparin  Continue to monitor H&H and platelets Follow up coronary CT in AM  Elma Fail, PharmD PGY1 Clinical Pharmacist [facility]  11/08/2024 10:20 PM       [1]  Allergies Allergen Reactions   Sulfa Antibiotics Rash

## 2024-11-08 NOTE — Progress Notes (Signed)
 Interventional cardiology note:  Called due to STEMI activation.  Patient is a 89 year old male with history of hypertension, hyperlipidemia, OSA, and CKD stage IIIb developed chest pain earlier this evening.  About 3 or 4 hours ago he developed chest discomfort.  He checked his blood pressure noted to be around 200 mm systolic.  He says his chest discomfort was 2 out of 10 in severity.  He laid down and apparently this did not help.  He ultimately ended up calling EMS.  An EKG demonstrated mild ST elevations inferiorly without reciprocal changes.  On examination here the patient is quite comfortable.  He has equal pulses.  Blood pressure here is 198/75 mmHg.  Discussed with ED staff.  Will cancel STEMI activation.  ED staff to undergo evaluation and potentially obtain dissection study.  Seems more like hypertensive urgency/emergency.

## 2024-11-08 NOTE — ED Provider Notes (Signed)
 " [facility] Provider Note   CSN: [id number] Arrival date & time: 11/08/24  1821     Patient presents with: No chief complaint on file.   Jeremy Finley is a 89 y.o. male.   The history is provided by the patient, the EMS personnel and medical records. No language interpreter was used.  Chest Pain Pain location:  L chest Pain quality: aching and dull   Pain radiates to:  Does not radiate Pain severity:  Mild Onset quality:  Gradual Duration:  4 hours Timing:  Constant Progression:  Unchanged Chronicity:  New Context: at rest   Relieved by:  Nothing Worsened by:  Nothing Ineffective treatments:  None tried Associated symptoms: no abdominal pain, no anxiety, no back pain, no cough, no diaphoresis, no dizziness, no fatigue, no headache, no lower extremity edema, no nausea, no near-syncope, no numbness, no palpitations, no shortness of breath, no vomiting and no weakness        Prior to Admission medications  Medication Sig Start Date End Date Taking? Authorizing Provider  aMILoride (MIDAMOR) 5 MG tablet Take 5 mg by mouth daily. 03/05/23   [provider]  busPIRone  (BUSPAR ) 5 MG tablet 1 tablet Orally Twice a day for 90 days    [provider]  cholecalciferol (VITAMIN D3) 25 MCG (1000 UNIT) tablet Take 1,000 Units by mouth daily. Vitamin d3 50mg     [provider]  escitalopram  (LEXAPRO ) 20 MG tablet Take 20 mg by mouth daily. 04/25/24   [provider]  losartan  (COZAAR ) 100 MG tablet Take 1 tablet by mouth daily. 07/31/11   [provider]  pravastatin  (PRAVACHOL ) 10 MG tablet Take 10 mg by mouth daily.    [provider]  propranolol  (INDERAL ) 10 MG tablet Take 10 mg by mouth daily.    [provider]    Allergies: Sulfa antibiotics    Review of Systems  Constitutional:  Negative for chills, diaphoresis and fatigue.  HENT:  Negative for congestion.   Eyes:   Negative for visual disturbance.  Respiratory:  Negative for cough, shortness of breath and wheezing.   Cardiovascular:  Positive for chest pain. Negative for palpitations, leg swelling and near-syncope.  Gastrointestinal:  Negative for abdominal pain, constipation, diarrhea, nausea and vomiting.  Genitourinary:  Negative for dysuria, flank pain and frequency.  Musculoskeletal:  Negative for back pain, neck pain and neck stiffness.  Skin:  Negative for rash and wound.  Neurological:  Negative for dizziness, weakness, light-headedness, numbness and headaches.  Psychiatric/Behavioral:  Negative for agitation and confusion.   All other systems reviewed and are negative.   Updated Vital Signs BP (!) 156/78   Pulse 60   Temp 97.9 F (36.6 C) (Oral)   Resp 16   Wt 83.9 kg   SpO2 98%   BMI 26.54 kg/m   Physical Exam Vitals and nursing note reviewed.  Constitutional:      General: He is not in acute distress.    Appearance: He is well-developed. He is not ill-appearing, toxic-appearing or diaphoretic.  HENT:     Head: Normocephalic and atraumatic.     Nose: No congestion or rhinorrhea.     Mouth/Throat:     Pharynx: No oropharyngeal exudate or posterior oropharyngeal erythema.  Eyes:     Extraocular Movements: Extraocular movements intact.     Conjunctiva/sclera: Conjunctivae normal.     Pupils: Pupils are equal, round, and reactive to light.  Cardiovascular:  Rate and Rhythm: Normal rate and regular rhythm.     Pulses: Normal pulses.     Heart sounds: No murmur heard. Pulmonary:     Effort: Pulmonary effort is normal. No respiratory distress.     Breath sounds: Normal breath sounds. No wheezing, rhonchi or rales.  Chest:     Chest wall: No tenderness.  Abdominal:     General: Abdomen is flat.     Palpations: Abdomen is soft.     Tenderness: There is no abdominal tenderness. There is no guarding or rebound.  Musculoskeletal:        General: No swelling or tenderness.      Cervical back: Neck supple.     Right lower leg: No edema.     Left lower leg: No edema.  Skin:    General: Skin is warm and dry.     Capillary Refill: Capillary refill takes less than 2 seconds.     Findings: No erythema or rash.  Neurological:     General: No focal deficit present.     Mental Status: He is alert.  Psychiatric:        Mood and Affect: Mood normal.     (all labs ordered are listed, but only abnormal results are displayed) Labs Reviewed  COMPREHENSIVE METABOLIC PANEL WITH GFR - Abnormal; Notable for the following components:      Result Value   Glucose, Bld 110 (*)    BUN 24 (*)    Total Protein 6.4 (*)    AST 51 (*)    GFR, Estimated 57 (*)    All other components within normal limits  PRO BRAIN NATRIURETIC PEPTIDE - Abnormal; Notable for the following components:   Pro Brain Natriuretic Peptide 1,719.0 (*)    All other components within normal limits  LIPID PANEL - Abnormal; Notable for the following components:   Triglycerides 151 (*)    HDL 40 (*)    All other components within normal limits  I-STAT CHEM 8, ED - Abnormal; Notable for the following components:   BUN 26 (*)    Creatinine, Ser 1.30 (*)    Glucose, Bld 105 (*)    All other components within normal limits  TROPONIN T, HIGH SENSITIVITY - Abnormal; Notable for the following components:   Troponin T High Sensitivity 258 (*)    All other components within normal limits  TROPONIN T, HIGH SENSITIVITY - Abnormal; Notable for the following components:   Troponin T High Sensitivity 515 (*)    All other components within normal limits  CBC WITH DIFFERENTIAL/PLATELET  LIPASE, BLOOD  PROTIME-INR  TSH  HEPARIN  LEVEL (UNFRACTIONATED)  CBC    EKG: None  Radiology: CT Angio Chest/Abd/Pel for Dissection W and/or Wo Contrast Result Date: 11/08/2024 EXAM: CTA CHEST 11/08/2024 07:58:00 PM TECHNIQUE: CTA of the chest was performed without and with the administration of 85 mL of intravenous iohexol   (OMNIPAQUE ) 350 MG/ML injection. Multiplanar reformatted images are provided for review. MIP images are provided for review. Automated exposure control, iterative reconstruction, and/or weight based adjustment of the mA/kV was utilized to reduce the radiation dose to as low as reasonably achievable. COMPARISON: None available. CLINICAL HISTORY: Blood pressure 200, new chest pain. STEMI and route but canceled on arrival. Rule out dissection for chest pain. FINDINGS: PULMONARY ARTERIES: Pulmonary arteries are adequately opacified for evaluation. No acute pulmonary embolus. Main pulmonary artery is normal in caliber. MEDIASTINUM: Enlarged right atrium. 4-vessel coronary artery calcification. The thoracic aorta is normal in  caliber. No thoracic aorta dissection. Mild atherosclerotic plaque of the aorta. The esophagus is unremarkable. The thyroid gland is unremarkable. The pericardium demonstrates no acute abnormality. LYMPH NODES: Associated calcified right hilar lymph nodes. No mediastinal or axillary lymphadenopathy. LUNGS AND PLEURA: Multiple scattered pulmonary micronodules, calcified and noncalcified, within the right lung. Associated calcified right hilar lymph nodes. Findings suggestive of prior granulomatous disease. Largest nodule measure up to 5 mm within the right lower lobe (6:131). No focal consolidation or pulmonary edema. No evidence of pleural effusion or pneumothorax. UPPER ABDOMEN: The abdominal aorta is normal in caliber. No abdominal aorta dissection. Tiny hiatal hernia. Fluid-density lesions of the kidneys likely represent simple renal cysts. Simple renal cysts do not require additional follow-up unless clinically indicated due to signs/symptoms. No small or large bowel thickening or dilatation. The appendix is unremarkable. Colonic diverticulosis. The prostate is enlarged, measuring up to 5.5 cm. Small fat-containing right inguinal hernia. SOFT TISSUES AND BONES: Multilevel moderate severity  degenerative changes of the spine. No acute displaced fracture. No suspicious lytic or blastic osseous lesions. Diffusely decreased bone density. No acute soft tissue abnormality. IMPRESSION: 1. No thoracic or abdominal aorta dissection. 2. Atherosclerotic plaque with 4-vessel coronary artery calcifications. 3. 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 4. Other, non-acute and/or normal findings as above. Electronically signed by: Morgane Naveau MD 11/08/2024 08:11 PM EST RP Workstation: [id number]     Procedures    CRITICAL CARE Performed by: Lonni PARAS Meriem Lemieux Total critical care time: 35 minutes Critical care time was exclusive of separately billable procedures and treating other patients. Critical care was necessary to treat or prevent imminent or life-threatening deterioration. Critical care was time spent personally by me on the following activities: development of treatment plan with patient and/or surrogate as well as nursing, discussions with consultants, evaluation of patient's response to treatment, examination of patient, obtaining history from patient or surrogate, ordering and performing treatments and interventions, ordering and review of laboratory studies, ordering and review of radiographic studies, pulse oximetry and re-evaluation of patient's condition.  Medications Ordered in the ED  heparin  ADULT infusion 100 units/mL (25000 units/250mL) (1,000 Units/hr Intravenous New Bag/Given 11/08/24 2248)  hydrALAZINE  (APRESOLINE ) injection 10 mg (10 mg Intravenous Given 11/08/24 1837)  iohexol  (OMNIPAQUE ) 350 MG/ML injection 85 mL (85 mLs Intravenous Contrast Given 11/08/24 2001)  aspirin  tablet 325 mg (325 mg Oral Given 11/08/24 2245)  heparin  bolus via infusion 4,000 Units (4,000 Units Intravenous Bolus from Bag 11/08/24 2245)                                    Medical Decision Making Amount and/or Complexity of Data Reviewed Labs: ordered. Radiology:  ordered.  Risk Prescription drug management. Decision regarding hospitalization.    KHING BELCHER Finley is a 89 y.o. male with a past medical history significant for hypertension, hyperlipidemia, previous diverticulosis, and sleep apnea who presents for chest pain and elevated blood pressures.  According to EMS, they activated as a code STEMI due to the new left-sided chest discomfort and some EKG abnormalities and route.  His blood pressure was about 200 systolic with EMS.  He reports he takes medicine for blood pressure and is normally not this high.  Otherwise he reports has been feeling well with no recent fevers, chills, congestion, cough, nausea, vomiting, constipation, diarrhea, or urinary changes.  Denies leg pain or leg swelling.  He reports that his chest started  hurting while sitting relaxing about 4 hours ago and his left central chest that was aching and dull.  It did not radiate, was not sharp, and nonpleuritic.  Did not go to his back or down his arms or up to his jaw or neck.  He reports no other complaints specifically with no diaphoresis nausea or shortness of breath.  Patient was given aspirin  with EMS and transferred here for evaluation.  On arrival, EKG does not seem to stoke STEMI and his exam is fairly reassuring with the interventional cardiology team.  They would like to deactivate the STEMI which is reasonable.  They did agree with workup including a dissection study given his blood pressure still in the 190s.  Heart rate is in the 50s.  Will give some hydralazine  and get the dissection study and labs.  On my further exam, lungs clear.  Chest nontender.  I do not appreciate a murmur.  Intact pulses in extremities.  Patient resting comfortably without acute distress.  He will get the dissection study, labs, and we will give him the hydralazine .  Suspect either hypertensive urgency/emergency given his elevated blood pressure and the symptoms.  Given his age and symptoms,  anticipate admission likely to medicine with cardiology following consultation for chest discomfort in the setting of elevated blood pressure.  Anticipate reassessment after workup.  Initial troponin was 258 and second troponin is over 500.  Cardiology came to see him and will start heparin  but they recommend medicine admission given the blood pressure elevations initially with possible hypertensive urgency.  CT did not show dissection.  Will call for medicine admission.  Medicine will admit and cardiology will follow for further management of NSTEMI and elevated blood pressures.     Final diagnoses:  NSTEMI (non-ST elevated myocardial infarction) (HCC)  Chest pain, unspecified type  Elevated blood pressure reading      Clinical Impression: 1. NSTEMI (non-ST elevated myocardial infarction) (HCC)   2. Chest pain, unspecified type   3. Elevated blood pressure reading     Disposition: Admit  This note was prepared with assistance of Dragon voice recognition software. Occasional wrong-word or sound-a-like substitutions may have occurred due to the inherent limitations of voice recognition software.      Georgina Krist, Lonni PARAS, MD 11/08/24 2337  "

## 2024-11-08 NOTE — Consult Note (Addendum)
 "  Cardiology Consultation   Patient ID: Jeremy Finley MRN: [id number]; DOB: 01/27/1935  Admit date: 11/08/2024 Date of Consult: 11/08/2024  PCP:  Charlott Dorn LABOR, MD   [facility] Providers Cardiologist:  None        Patient Profile: Jeremy Finley is a 89 y.o. male with a hx of Chronic kidney disease, obstructive sleep apnea who is being seen 11/08/2024 for the evaluation of chest pain at the request of the emergency department.  History of Present Illness: [name] states he was in his usual state health until few days ago, when he developed a pressure like pain.  He states the pain was a 2 out of 10 in severity, nonradiating.  He describes the pain as a pressure-like sensation.  Patient performs yoga twice a week and states he has not had this type of pain before.  The pain is not debilitating.  Associated symptoms include some dizziness.  This morning, patient's systolic blood pressure was in the 200s.  This prompted an ER visit.  Review of systems is negative for nausea, vomiting, syncope, shortness of breath, leg swelling, orthopnea.  Patient has never seen a cardiologist before.  He is never had a stress test.  He sees his primary physician for management of hypertension.  He is on amlodipine  and propranolol , which he states he has been taking for at least 50 years.  States he was previously on an antihypertensive medicine, however this was stopped in October due to hypotension.  He checks his blood pressures at least 2-3 times a week, and states his blood pressures have been gradually increasing since this medicine was discontinued.  Patient has previously taken a baby aspirin , however he stopped taking this sometime ago.  On arrival, blood pressure was 190/84.  Patient continues to have past chest pain, however the pain is remains at a 2 out of 10 and has not worsened.  ECG showed concern for potential ST elevation in the inferior leads.  Per the day team,  case was discussed with on-call interventional attending, who felt that ECG is likely not from ACS.  Patient lives in [place] with his wife.  Not a smoker.  His father had a history of heart disease when he was 44.  Patient ambulates without a device.  Notable labs include high-sensitivity troponin 258-> 515, serum sodium 141, potassium 4.0, serum creatinine 1.3, CT chest angiogram negative for pulmonary embolism but shows atherosclerotic plaque with four-vessel coronary artery calcifications.  No dissection was seen on imaging.   Past Medical History:  Diagnosis Date   Environmental allergies    Hypertension    Sleep apnea     Past Surgical History:  Procedure Laterality Date   HERNIA REPAIR  4 years ago     Home Medications:  Prior to Admission medications  Medication Sig Start Date End Date Taking? Authorizing Provider  aMILoride (MIDAMOR) 5 MG tablet Take 5 mg by mouth daily. 03/05/23   [provider]  busPIRone  (BUSPAR ) 5 MG tablet 1 tablet Orally Twice a day for 90 days    [provider]  cholecalciferol (VITAMIN D3) 25 MCG (1000 UNIT) tablet Take 1,000 Units by mouth daily. Vitamin d3 50mg     [provider]  escitalopram  (LEXAPRO ) 20 MG tablet Take 20 mg by mouth daily. 04/25/24   [provider]  losartan  (COZAAR ) 100 MG tablet Take 1 tablet by mouth daily. 07/31/11   [provider]  pravastatin  (PRAVACHOL )  10 MG tablet Take 10 mg by mouth daily.    [provider]  propranolol  (INDERAL ) 10 MG tablet Take 10 mg by mouth daily.    [provider]    Scheduled Meds:  Continuous Infusions:  PRN Meds:   Allergies:   Allergies[1]  Social History:   Social History   Socioeconomic History   Marital status: Married    Spouse name: Jeremy Finley   Number of children: 2   Years of education: Not on file   Highest education level: Not on file  Occupational History   Occupation: HR director   Tobacco Use   Smoking status: Former    Current packs/day: 0.00    Types: Cigarettes    Quit date: 10/23/1968    Years since quitting: 56.0   Smokeless tobacco: Never  Vaping Use   Vaping status: Never Used  Substance and Sexual Activity   Alcohol use: Yes    Alcohol/week: 0.0 standard drinks of alcohol   Drug use: No   Sexual activity: Not on file  Other Topics Concern   Not on file  Social History Narrative   Not on file   Social Drivers of Health   Tobacco Use: Medium Risk (11/08/2024)   Patient History    Smoking Tobacco Use: Former    Smokeless Tobacco Use: Never    Passive Exposure: Not on Actuary Strain: Not on file  Food Insecurity: Not on file  Transportation Needs: Not on file  Physical Activity: Not on file  Stress: Not on file  Social Connections: Not on file  Intimate Partner Violence: Not on file  Depression (EYV7-0): Not on file  Alcohol Screen: Not on file  Housing: Not on file  Utilities: Not on file  Health Literacy: Not on file    Family History:    Family History  Problem Relation Age of Onset   Heart disease Father    Heart disease Mother      ROS:  Please see the history of present illness.   All other ROS reviewed and negative.     Physical Exam/Data: Vitals:   11/08/24 1830 11/08/24 1837 11/08/24 1845 11/08/24 1900  BP: (!) 190/84 (!) 190/84 (!) 160/84 (!) 157/70  Pulse: (!) 53  (!) 56 62  Resp: 14  15 16   Temp: 97.7 F (36.5 C)     TempSrc: Oral     SpO2: 100%  100% 100%   No intake or output data in the 24 hours ending 11/08/24 2052    03/26/2023   10:24 AM 03/25/2022   10:33 AM 03/25/2021   10:04 AM  Last 3 Weights  Weight (lbs) 194 lb 9.6 oz 200 lb 9.6 oz 201 lb 3.2 oz  Weight (kg) 88.27 kg 90.992 kg 91.264 kg     There is no height or weight on file to calculate BMI.  General:  Well nourished, well developed, in no acute distress HEENT: normal Neck: no JVD Vascular: No carotid bruits; Distal pulses  2+ bilaterally Cardiac:  normal S1, S2; RRR; no murmur  Lungs: good wob, bibasilar crackles on inspiration  Abd: soft, nontender, no hepatomegaly  Ext: no edema Musculoskeletal:  No deformities, BUE and BLE strength normal and equal Skin: warm and dry  Neuro:  CNs 2-12 intact, no focal abnormalities noted Psych:  Normal affect   EKG:  The EKG was personally reviewed and demonstrates:  reviewed Telemetry:  Telemetry was personally reviewed and demonstrates:  reviewed  Relevant  CV Studies: reviewed  Laboratory Data: High Sensitivity Troponin:  No results for input(s): TROPONINIHS in the last 720 hours.  Recent Labs  Lab 11/08/24 1829  TRNPT 258*      Chemistry Recent Labs  Lab 11/08/24 1829 11/08/24 1857  NA 138 141  K 4.2 4.0  CL 103 104  CO2 28  --   GLUCOSE 110* 105*  BUN 24* 26*  CREATININE 1.23 1.30*  CALCIUM  8.9  --   GFRNONAA 57*  --   ANIONGAP 7  --     Recent Labs  Lab 11/08/24 1829  PROT 6.4*  ALBUMIN 3.7  AST 51*  ALT 18  ALKPHOS 95  BILITOT 0.6   Lipids No results for input(s): CHOL, TRIG, HDL, LABVLDL, LDLCALC, CHOLHDL in the last 168 hours.  Hematology Recent Labs  Lab 11/08/24 1829 11/08/24 1857  WBC 8.4  --   RBC 4.56  --   HGB 14.7 14.3  HCT 43.7 42.0  MCV 95.8  --   MCH 32.2  --   MCHC 33.6  --   RDW 12.1  --   PLT 165  --    Thyroid  Recent Labs  Lab 11/08/24 1829  TSH 3.310    BNP Recent Labs  Lab 11/08/24 1829  PROBNP 1,719.0*    DDimer No results for input(s): DDIMER in the last 168 hours.  Radiology/Studies:  CT Angio Chest/Abd/Pel for Dissection W and/or Wo Contrast Result Date: 11/08/2024 EXAM: CTA CHEST 11/08/2024 07:58:00 PM TECHNIQUE: CTA of the chest was performed without and with the administration of 85 mL of intravenous iohexol  (OMNIPAQUE ) 350 MG/ML injection. Multiplanar reformatted images are provided for review. MIP images are provided for review. Automated exposure control, iterative  reconstruction, and/or weight based adjustment of the mA/kV was utilized to reduce the radiation dose to as low as reasonably achievable. COMPARISON: None available. CLINICAL HISTORY: Blood pressure 200, new chest pain. STEMI and route but canceled on arrival. Rule out dissection for chest pain. FINDINGS: PULMONARY ARTERIES: Pulmonary arteries are adequately opacified for evaluation. No acute pulmonary embolus. Main pulmonary artery is normal in caliber. MEDIASTINUM: Enlarged right atrium. 4-vessel coronary artery calcification. The thoracic aorta is normal in caliber. No thoracic aorta dissection. Mild atherosclerotic plaque of the aorta. The esophagus is unremarkable. The thyroid gland is unremarkable. The pericardium demonstrates no acute abnormality. LYMPH NODES: Associated calcified right hilar lymph nodes. No mediastinal or axillary lymphadenopathy. LUNGS AND PLEURA: Multiple scattered pulmonary micronodules, calcified and noncalcified, within the right lung. Associated calcified right hilar lymph nodes. Findings suggestive of prior granulomatous disease. Largest nodule measure up to 5 mm within the right lower lobe (6:131). No focal consolidation or pulmonary edema. No evidence of pleural effusion or pneumothorax. UPPER ABDOMEN: The abdominal aorta is normal in caliber. No abdominal aorta dissection. Tiny hiatal hernia. Fluid-density lesions of the kidneys likely represent simple renal cysts. Simple renal cysts do not require additional follow-up unless clinically indicated due to signs/symptoms. No small or large bowel thickening or dilatation. The appendix is unremarkable. Colonic diverticulosis. The prostate is enlarged, measuring up to 5.5 cm. Small fat-containing right inguinal hernia. SOFT TISSUES AND BONES: Multilevel moderate severity degenerative changes of the spine. No acute displaced fracture. No suspicious lytic or blastic osseous lesions. Diffusely decreased bone density. No acute soft tissue  abnormality. IMPRESSION: 1. No thoracic or abdominal aorta dissection. 2. Atherosclerotic plaque with 4-vessel coronary artery calcifications. 3. 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 4. Other, non-acute and/or normal findings  as above. Electronically signed by: Morgane Naveau MD 11/08/2024 08:11 PM EST RP Workstation: [id number]     Assessment and Plan:  Jeremy Finley is a 89 y.o. male with a hx of Chronic kidney disease, obstructive sleep apnea who is being seen for management of NSTEMI.  #Type Finley NSTEMI Patient symptoms do not sound cardiac in nature. Euvolemic on exam. Repeat ECG shows resolution of ST elevations.  I suspect his acute myocardial injury is in the setting of hypertension.  However, patient has risk factors for coronary artery disease which include advanced age, and uncontrolled hypertension.  He has not previously had a cardiac workup.  He has coronary calcification on CT angiogram.  Will treat hypertension while inpatient.  Given his risk factors and acute myocardial injury, will start heparin  and pursue further evaluation tomorrow.  If he has persistent pain despite improvement in blood pressure, will consider LHC. Symptoms acutely worsen while on heparin , will reengage interventional cardiology for left heart catheterization.  Treatment of hypertension per medicine team Start IV heparin  NPO at MN Coronary CT in AM TTE Lipid panel Aspirin  load    Risk Assessment/Risk Scores:    TIMI Risk Score for Unstable Angina or Non-ST Elevation MI:   The patient's TIMI risk score is  , which indicates a  % risk of all cause mortality, new or recurrent myocardial infarction or need for urgent revascularization in the next 14 days.         For questions or updates, please contact [facility] Please consult [email or web address] for contact info under      Signed, Shawanda Sievert A Jalon Blackwelder, MD  11/08/2024 8:52 PM      [1]  Allergies Allergen Reactions    Sulfa Antibiotics Rash   "

## 2024-11-08 NOTE — ED Triage Notes (Signed)
 Pt BIB EMS for chest pain. Pt reports chest pain started aprox 4 hours ago. Pt reports chest pain on left side but did not radiate with dizziness. Pt denies SOB and N/V. Pt is Aox4 upon arrival and experiencing chest pressure.   EMS gave 324 aspirin  en route.   EMS Vitals BP 178/98 HR 58 CBG 168

## 2024-11-09 ENCOUNTER — Other Ambulatory Visit: Payer: Self-pay

## 2024-11-09 ENCOUNTER — Inpatient Hospital Stay (HOSPITAL_COMMUNITY)

## 2024-11-09 ENCOUNTER — Encounter (HOSPITAL_COMMUNITY): Admission: EM | Disposition: A | Payer: Self-pay | Source: Home / Self Care | Attending: Internal Medicine

## 2024-11-09 DIAGNOSIS — I249 Acute ischemic heart disease, unspecified: Secondary | ICD-10-CM | POA: Diagnosis not present

## 2024-11-09 DIAGNOSIS — Z79899 Other long term (current) drug therapy: Secondary | ICD-10-CM | POA: Diagnosis not present

## 2024-11-09 DIAGNOSIS — Z882 Allergy status to sulfonamides status: Secondary | ICD-10-CM | POA: Diagnosis not present

## 2024-11-09 DIAGNOSIS — Z8249 Family history of ischemic heart disease and other diseases of the circulatory system: Secondary | ICD-10-CM | POA: Diagnosis not present

## 2024-11-09 DIAGNOSIS — E785 Hyperlipidemia, unspecified: Secondary | ICD-10-CM

## 2024-11-09 DIAGNOSIS — G4733 Obstructive sleep apnea (adult) (pediatric): Secondary | ICD-10-CM

## 2024-11-09 DIAGNOSIS — Z87891 Personal history of nicotine dependence: Secondary | ICD-10-CM | POA: Diagnosis not present

## 2024-11-09 DIAGNOSIS — R079 Chest pain, unspecified: Secondary | ICD-10-CM

## 2024-11-09 DIAGNOSIS — I214 Non-ST elevation (NSTEMI) myocardial infarction: Principal | ICD-10-CM | POA: Diagnosis present

## 2024-11-09 DIAGNOSIS — R001 Bradycardia, unspecified: Secondary | ICD-10-CM | POA: Diagnosis present

## 2024-11-09 DIAGNOSIS — Z66 Do not resuscitate: Secondary | ICD-10-CM | POA: Diagnosis present

## 2024-11-09 DIAGNOSIS — I2510 Atherosclerotic heart disease of native coronary artery without angina pectoris: Secondary | ICD-10-CM | POA: Diagnosis present

## 2024-11-09 DIAGNOSIS — I160 Hypertensive urgency: Secondary | ICD-10-CM | POA: Diagnosis present

## 2024-11-09 DIAGNOSIS — E7800 Pure hypercholesterolemia, unspecified: Secondary | ICD-10-CM | POA: Diagnosis present

## 2024-11-09 DIAGNOSIS — R7401 Elevation of levels of liver transaminase levels: Secondary | ICD-10-CM | POA: Diagnosis not present

## 2024-11-09 DIAGNOSIS — I10 Essential (primary) hypertension: Secondary | ICD-10-CM

## 2024-11-09 DIAGNOSIS — N1832 Chronic kidney disease, stage 3b: Secondary | ICD-10-CM | POA: Diagnosis present

## 2024-11-09 DIAGNOSIS — I129 Hypertensive chronic kidney disease with stage 1 through stage 4 chronic kidney disease, or unspecified chronic kidney disease: Secondary | ICD-10-CM | POA: Diagnosis present

## 2024-11-09 LAB — ECHOCARDIOGRAM COMPLETE
Area-P 1/2: 2.48 cm2
Calc EF: 59.6 %
Height: 70 in
S' Lateral: 3.3 cm
Single Plane A2C EF: 58.5 %
Single Plane A4C EF: 60.6 %
Weight: 3168 [oz_av]

## 2024-11-09 LAB — CBC
HCT: 41.9 % (ref 39.0–52.0)
Hemoglobin: 14.2 g/dL (ref 13.0–17.0)
MCH: 32.2 pg (ref 26.0–34.0)
MCHC: 33.9 g/dL (ref 30.0–36.0)
MCV: 95 fL (ref 80.0–100.0)
Platelets: 148 10*3/uL — ABNORMAL LOW (ref 150–400)
RBC: 4.41 MIL/uL (ref 4.22–5.81)
RDW: 12.1 % (ref 11.5–15.5)
WBC: 12.5 10*3/uL — ABNORMAL HIGH (ref 4.0–10.5)
nRBC: 0 % (ref 0.0–0.2)

## 2024-11-09 LAB — HEMOGLOBIN A1C
Hgb A1c MFr Bld: 5.4 % (ref 4.8–5.6)
Mean Plasma Glucose: 108.28 mg/dL

## 2024-11-09 LAB — COMPREHENSIVE METABOLIC PANEL WITH GFR
ALT: 20 U/L (ref 0–44)
AST: 95 U/L — ABNORMAL HIGH (ref 15–41)
Albumin: 3.4 g/dL — ABNORMAL LOW (ref 3.5–5.0)
Alkaline Phosphatase: 90 U/L (ref 38–126)
Anion gap: 10 (ref 5–15)
BUN: 20 mg/dL (ref 8–23)
CO2: 24 mmol/L (ref 22–32)
Calcium: 8.7 mg/dL — ABNORMAL LOW (ref 8.9–10.3)
Chloride: 103 mmol/L (ref 98–111)
Creatinine, Ser: 1.02 mg/dL (ref 0.61–1.24)
GFR, Estimated: >60 mL/min
Glucose, Bld: 95 mg/dL (ref 70–99)
Potassium: 3.9 mmol/L (ref 3.5–5.1)
Sodium: 137 mmol/L (ref 135–145)
Total Bilirubin: 0.8 mg/dL (ref 0.0–1.2)
Total Protein: 6.1 g/dL — ABNORMAL LOW (ref 6.5–8.1)

## 2024-11-09 LAB — HEPARIN LEVEL (UNFRACTIONATED): Heparin Unfractionated: 0.67 [IU]/mL (ref 0.30–0.70)

## 2024-11-09 LAB — TSH: TSH: 4.23 u[IU]/mL (ref 0.350–4.500)

## 2024-11-09 LAB — TROPONIN T, HIGH SENSITIVITY
Troponin T High Sensitivity: 1151 ng/L (ref 0–19)
Troponin T High Sensitivity: 1267 ng/L (ref 0–19)
Troponin T High Sensitivity: 1191 ng/L (ref 0–19)
Troponin T High Sensitivity: 1186 ng/L (ref 0–19)

## 2024-11-09 LAB — GLUCOSE, CAPILLARY: Glucose-Capillary: 101 mg/dL — ABNORMAL HIGH (ref 70–99)

## 2024-11-09 LAB — POCT ACTIVATED CLOTTING TIME: Activated Clotting Time: 266 s

## 2024-11-09 MED ORDER — HYDRALAZINE HCL 20 MG/ML IJ SOLN: 10.0000 mg | Freq: Four times a day (QID) | INTRAMUSCULAR | Status: DC | PRN

## 2024-11-09 MED ORDER — PROPRANOLOL HCL 10 MG PO TABS: 10.0000 mg | ORAL_TABLET | Freq: Every day | ORAL | Status: DC

## 2024-11-09 MED ORDER — ASPIRIN 81 MG PO CHEW: 81.0000 mg | CHEWABLE_TABLET | ORAL | Status: DC

## 2024-11-09 MED ORDER — ACETAMINOPHEN 650 MG RE SUPP: 650.0000 mg | Freq: Four times a day (QID) | RECTAL | Status: DC | PRN

## 2024-11-09 MED ORDER — SODIUM CHLORIDE 0.9% FLUSH
3.0000 mL | Freq: Two times a day (BID) | INTRAVENOUS | Status: DC
  Administered 2024-11-09 – 2024-11-10 (×3): 3 mL via INTRAVENOUS

## 2024-11-09 MED ORDER — MORPHINE SULFATE (PF) 2 MG/ML IV SOLN
1.0000 mg | Freq: Once | INTRAVENOUS | Status: AC
  Administered 2024-11-09: 1 mg via INTRAVENOUS
  Filled 2024-11-09: qty 1

## 2024-11-09 MED ORDER — SODIUM CHLORIDE 0.9% FLUSH: 3.0000 mL | INTRAVENOUS | Status: DC | PRN

## 2024-11-09 MED ORDER — SODIUM CHLORIDE 0.9 % IV SOLN
INTRAVENOUS | Status: DC | PRN
  Administered 2024-11-09: 50 mL/h via INTRAVENOUS

## 2024-11-09 MED ORDER — ESCITALOPRAM OXALATE 10 MG PO TABS
20.0000 mg | ORAL_TABLET | Freq: Every day | ORAL | Status: DC
  Administered 2024-11-09 – 2024-11-10 (×2): 20 mg via ORAL
  Filled 2024-11-09 (×2): qty 2

## 2024-11-09 MED ORDER — TICAGRELOR 90 MG PO TABS
ORAL_TABLET | ORAL | Status: DC | PRN
  Administered 2024-11-09: 180 mg via ORAL

## 2024-11-09 MED ORDER — HEPARIN SODIUM (PORCINE) 1000 UNIT/ML IJ SOLN
INTRAMUSCULAR | Status: AC
  Filled 2024-11-09: qty 10

## 2024-11-09 MED ORDER — IPRATROPIUM-ALBUTEROL 0.5-2.5 (3) MG/3ML IN SOLN: 3.0000 mL | RESPIRATORY_TRACT | Status: DC | PRN

## 2024-11-09 MED ORDER — GUAIFENESIN 100 MG/5ML PO LIQD: 5.0000 mL | ORAL | Status: DC | PRN

## 2024-11-09 MED ORDER — SODIUM CHLORIDE 0.9 % IV SOLN: 250.0000 mL | INTRAVENOUS | Status: DC | PRN

## 2024-11-09 MED ORDER — HYDRALAZINE HCL 20 MG/ML IJ SOLN: 10.0000 mg | INTRAMUSCULAR | Status: DC | PRN

## 2024-11-09 MED ORDER — NITROGLYCERIN 1 MG/10 ML FOR IR/CATH LAB
INTRA_ARTERIAL | Status: AC
  Filled 2024-11-09: qty 10

## 2024-11-09 MED ORDER — NITROGLYCERIN IN D5W 200-5 MCG/ML-% IV SOLN: 0.0000 ug/min | INTRAVENOUS | Status: DC

## 2024-11-09 MED ORDER — TICAGRELOR 90 MG PO TABS: 90.0000 mg | ORAL_TABLET | Freq: Two times a day (BID) | ORAL | Status: DC

## 2024-11-09 MED ORDER — FREE WATER
500.0000 mL | Freq: Once | Status: AC
  Administered 2024-11-09: 500 mL via ORAL

## 2024-11-09 MED ORDER — ATORVASTATIN CALCIUM 80 MG PO TABS
80.0000 mg | ORAL_TABLET | Freq: Every day | ORAL | Status: DC
  Administered 2024-11-09 – 2024-11-10 (×2): 80 mg via ORAL
  Filled 2024-11-09 (×2): qty 1

## 2024-11-09 MED ORDER — LIDOCAINE HCL (PF) 1 % IJ SOLN
INTRAMUSCULAR | Status: AC
  Filled 2024-11-09: qty 30

## 2024-11-09 MED ORDER — ASPIRIN 81 MG PO TBEC
81.0000 mg | DELAYED_RELEASE_TABLET | Freq: Every day | ORAL | Status: DC
  Administered 2024-11-09: 81 mg via ORAL
  Filled 2024-11-09: qty 1

## 2024-11-09 MED ORDER — FENTANYL CITRATE (PF) 100 MCG/2ML IJ SOLN
INTRAMUSCULAR | Status: DC | PRN
  Administered 2024-11-09: 50 ug via INTRAVENOUS

## 2024-11-09 MED ORDER — VERAPAMIL HCL 2.5 MG/ML IV SOLN
INTRAVENOUS | Status: AC
  Filled 2024-11-09: qty 2

## 2024-11-09 MED ORDER — ONDANSETRON HCL 4 MG/2ML IJ SOLN: 4.0000 mg | Freq: Four times a day (QID) | INTRAMUSCULAR | Status: DC | PRN

## 2024-11-09 MED ORDER — METOPROLOL TARTRATE 5 MG/5ML IV SOLN: 5.0000 mg | INTRAVENOUS | Status: DC | PRN

## 2024-11-09 MED ORDER — BUSPIRONE HCL 5 MG PO TABS
5.0000 mg | ORAL_TABLET | Freq: Two times a day (BID) | ORAL | Status: DC
  Administered 2024-11-09 – 2024-11-10 (×4): 5 mg via ORAL
  Filled 2024-11-09 (×4): qty 1

## 2024-11-09 MED ORDER — HEPARIN SODIUM (PORCINE) 1000 UNIT/ML IJ SOLN
INTRAMUSCULAR | Status: DC | PRN
  Administered 2024-11-09: 5000 [IU] via INTRAVENOUS
  Administered 2024-11-09: 4000 [IU] via INTRAVENOUS

## 2024-11-09 MED ORDER — MIDAZOLAM HCL (PF) 2 MG/2ML IJ SOLN
INTRAMUSCULAR | Status: DC | PRN
  Administered 2024-11-09: 1 mg via INTRAVENOUS

## 2024-11-09 MED ORDER — LOSARTAN POTASSIUM 50 MG PO TABS
100.0000 mg | ORAL_TABLET | Freq: Every morning | ORAL | Status: DC
  Administered 2024-11-09 – 2024-11-10 (×2): 100 mg via ORAL
  Filled 2024-11-09 (×2): qty 2

## 2024-11-09 MED ORDER — NITROGLYCERIN 1 MG/10 ML FOR IR/CATH LAB
INTRA_ARTERIAL | Status: DC | PRN
  Administered 2024-11-09 (×2): 200 ug via INTRACORONARY

## 2024-11-09 MED ORDER — IOHEXOL 350 MG/ML SOLN
INTRAVENOUS | Status: DC | PRN
  Administered 2024-11-09: 80 mL

## 2024-11-09 MED ORDER — ACETAMINOPHEN 325 MG PO TABS: 650.0000 mg | ORAL_TABLET | Freq: Four times a day (QID) | ORAL | Status: DC | PRN

## 2024-11-09 MED ORDER — BISACODYL 5 MG PO TBEC: 5.0000 mg | DELAYED_RELEASE_TABLET | Freq: Every day | ORAL | Status: DC | PRN

## 2024-11-09 MED ORDER — LIDOCAINE HCL (PF) 1 % IJ SOLN
INTRAMUSCULAR | Status: DC | PRN
  Administered 2024-11-09: 5 mL via INTRADERMAL

## 2024-11-09 MED ORDER — ONDANSETRON HCL 4 MG PO TABS: 4.0000 mg | ORAL_TABLET | Freq: Four times a day (QID) | ORAL | Status: DC | PRN

## 2024-11-09 MED ORDER — FENTANYL CITRATE (PF) 100 MCG/2ML IJ SOLN
INTRAMUSCULAR | Status: AC
  Filled 2024-11-09: qty 2

## 2024-11-09 MED ORDER — MIDAZOLAM HCL 2 MG/2ML IJ SOLN
INTRAMUSCULAR | Status: AC
  Filled 2024-11-09: qty 2

## 2024-11-09 MED ORDER — ASPIRIN 81 MG PO CHEW
81.0000 mg | CHEWABLE_TABLET | Freq: Every day | ORAL | Status: DC
  Administered 2024-11-10: 81 mg via ORAL
  Filled 2024-11-09: qty 1

## 2024-11-09 MED ORDER — SODIUM CHLORIDE 0.9% FLUSH
3.0000 mL | Freq: Two times a day (BID) | INTRAVENOUS | Status: DC
  Administered 2024-11-09 – 2024-11-10 (×2): 3 mL via INTRAVENOUS

## 2024-11-09 MED ORDER — AMLODIPINE BESYLATE 5 MG PO TABS
5.0000 mg | ORAL_TABLET | Freq: Every day | ORAL | Status: DC
  Administered 2024-11-09 – 2024-11-10 (×2): 5 mg via ORAL
  Filled 2024-11-09 (×2): qty 1

## 2024-11-09 MED ORDER — TICAGRELOR 90 MG PO TABS
ORAL_TABLET | ORAL | Status: AC
  Filled 2024-11-09: qty 2

## 2024-11-09 MED ORDER — HEPARIN (PORCINE) IN NACL 1000-0.9 UT/500ML-% IV SOLN
INTRAVENOUS | Status: DC | PRN
  Administered 2024-11-09 (×2): 500 mL

## 2024-11-09 MED ORDER — PRAVASTATIN SODIUM 10 MG PO TABS: 20.0000 mg | ORAL_TABLET | Freq: Every day | ORAL | Status: DC

## 2024-11-09 MED ORDER — PERFLUTREN LIPID MICROSPHERE
1.0000 mL | INTRAVENOUS | Status: AC | PRN
  Administered 2024-11-09: 4 mL via INTRAVENOUS

## 2024-11-09 MED ORDER — VERAPAMIL HCL 2.5 MG/ML IV SOLN
INTRAVENOUS | Status: DC | PRN
  Administered 2024-11-09: 10 mL via INTRA_ARTERIAL

## 2024-11-09 NOTE — Progress Notes (Signed)
 Echocardiogram 2D Echocardiogram has been performed.  Jeremy Finley 11/09/2024, 9:12 AM

## 2024-11-09 NOTE — Progress Notes (Signed)
 Pt arrived to unit on heparin  drip. Pt reporting no chest pain at this time. Nitroglycerin  drip not started in ED. BP 155/77. Notified Dr. Caleen and ok to stay off nitroglycerin  drip at this time. Administered pt daily medications with MD approval.

## 2024-11-09 NOTE — Hospital Course (Addendum)
 Brief Narrative:  89 year old with history of OSA on CPAP, HTN, HLD admitted for chest pain and elevated blood pressure.  Originally thought to be STEMI later canceled by cardiology due to no evidence.  Admitted to the hospital for hypertensive urgency/NSTEMI.   Assessment & Plan:  Hypertensive urgency with history of essential hypertension Chest pain, NSTEMI Elevated troponin - Troponins continue to trend up 258 > 1151.  EKG not showing any active ischemic changes.  Will continue to trend until it peaks.  Echocardiogram ordered - Heparin  drip, nitroglycerin  drip - Patient is currently n.p.o. for ischemic evaluation - CTA chest is negative for PE but does have four-vessel disease - Plans for LHC today - LDL 82, TSH normal, check J8r  OSA on CPAP  Hyperlipidemia - LDL 82.  Lipitor  Mild transaminitis -AST > ALT   DVT prophylaxis: Heparin  drip    Code Status: Limited: Do not attempt resuscitation (DNR) -DNR-LIMITED -Do Not Intubate/DNI  Family Communication: Daughter at bedside Status is: Inpatient Remains inpatient appropriate because: Ongoing ischemic evaluation   PT Follow up Recs:   Subjective: No chest pain during my visit.  Waiting left heart cath   Examination:  General exam: Appears calm and comfortable  Respiratory system: Clear to auscultation. Respiratory effort normal. Cardiovascular system: S1 & S2 heard, RRR. No JVD, murmurs, rubs, gallops or clicks. No pedal edema. Gastrointestinal system: Abdomen is nondistended, soft and nontender. No organomegaly or masses felt. Normal bowel sounds heard. Central nervous system: Alert and oriented. No focal neurological deficits. Extremities: Symmetric 5 x 5 power. Skin: No rashes, lesions or ulcers Psychiatry: Judgement and insight appear normal. Mood & affect appropriate.

## 2024-11-09 NOTE — H&P (Addendum)
 " History and Physical    Patient: Jeremy Finley FMW:[id number] DOB: March 31, 1935 DOA: 11/08/2024 DOS: the patient was seen and examined on 11/09/2024 PCP: Charlott Dorn LABOR, MD  Patient coming from: Home  Chief Complaint:  Chief Complaint  Patient presents with   Code STEMI   HPI: Jeremy Finley is a 89 y.o. male with medical history significant for obstructive sleep apnea on CPAP, hypertension, and hypercholesterolemia presents with chest pain and high bp.  The patient says that sometime yesterday he developed a dull ache in the left side of his chest.  He gives it a 2 out of 10.  He did not pay much attention to it it continued all day.  The following day he was still having this pressure in his chest, but because it did not go away he became a little worried about his heart.  He checked his blood pressure at home and it was 200/100.  That is much higher than it usually is so he came in for evaluation.  In the emergency department the patient continued to have the 2 out of 10 lateral chest pain.  He was originally treated as a STEMI alert.  He was seen by cardiology on arrival and the STEMI alert was canceled as his repeat ECG no longer showed ischemia.  He was given an aspirin .  His blood work revealed an elevated troponin of 258.  3 hours later his troponin had gone up to 515.  His BUN initially 24 with a creatinine of 1.23.  His BNP was 1719.  His white blood cell count was normal at 8 hemoglobin 14.7 hematocrit 43.7 platelet count 165.  Cardiology feels that his chest pain may be from malignant hypertension.  Cardiology has recommended that medicine admit the patient and work on controlling his blood pressure.   Review of Systems: As mentioned in the history of present illness. All other systems reviewed and are negative. Past Medical History:  Diagnosis Date   Environmental allergies    Hypertension    Sleep apnea    Past Surgical History:  Procedure Laterality Date   HERNIA  REPAIR  4 years ago   Social History:  reports that he quit smoking about 56 years ago. His smoking use included cigarettes. He has never used smokeless tobacco. He reports current alcohol use. He reports that he does not use drugs.  Allergies[1]  Family History  Problem Relation Age of Onset   Heart disease Father    Heart disease Mother     Prior to Admission medications  Medication Sig Start Date End Date Taking? Authorizing Provider  acetaminophen  (TYLENOL ) 500 MG tablet Take 500 mg by mouth every 6 (six) hours as needed for mild pain (pain score 1-3), headache or moderate pain (pain score 4-6).   Yes [provider]  busPIRone  (BUSPAR ) 5 MG tablet Take 5 mg by mouth in the morning and at bedtime.   Yes [provider]  Cholecalciferol (VITAMIN D) 50 MCG (2000 UT) tablet Take 2,000 Units by mouth in the morning.   Yes [provider]  escitalopram  (LEXAPRO ) 20 MG tablet Take 20 mg by mouth daily. 04/25/24  Yes [provider]  ibuprofen (ADVIL) 200 MG tablet Take 200-400 mg by mouth every 6 (six) hours as needed for headache or mild pain (pain score 1-3).   Yes [provider]  losartan  (COZAAR ) 100 MG tablet Take 100 mg by mouth in the morning.   Yes [provider]  Olopatadine HCl 0.2 % SOLN Place 1-2 drops into both eyes 2 (two) times daily as needed (eye irritation).   Yes [provider]  pravastatin  (PRAVACHOL ) 20 MG tablet Take 20 mg by mouth daily.   Yes [provider]  propranolol  (INDERAL ) 10 MG tablet Take 10 mg by mouth daily.   Yes [provider]    Physical Exam: Vitals:   11/08/24 2316 11/08/24 2330 11/08/24 2345 11/09/24 0000  BP:  (!) 161/78 (!) 154/81 (!) 147/72  Pulse:  (!) 57 (!) 50 60  Resp:  16 13 16   Temp: 97.9 F (36.6 C)     TempSrc: Oral     SpO2:  99% 99% 99%  Weight:       Physical Exam:  General: No acute distress, well developed, well nourished HEENT:  Normocephalic, atraumatic, PERRL Cardiovascular: Normal rate and rhythm. Distal pulses intact. Pulmonary: Normal pulmonary effort, normal breath sounds Gastrointestinal: Nondistended abdomen, soft, non-tender, normoactive bowel sounds, no organomegaly Musculoskeletal:Normal ROM, no lower ext edema Lymphadenopathy: No cervical LAD. Skin: Skin is warm and dry. Neuro: No focal deficits noted, AAOx3. PSYCH: Attentive and cooperative  Data Reviewed:  Results for orders placed or performed during the hospital encounter of 11/08/24 (from the past 24 hours)  CBC with Differential     Status: None   Collection Time: 11/08/24  6:29 PM  Result Value Ref Range   WBC 8.4 4.0 - 10.5 K/uL   RBC 4.56 4.22 - 5.81 MIL/uL   Hemoglobin 14.7 13.0 - 17.0 g/dL   HCT 56.2 60.9 - 47.9 %   MCV 95.8 80.0 - 100.0 fL   MCH 32.2 26.0 - 34.0 pg   MCHC 33.6 30.0 - 36.0 g/dL   RDW 87.8 88.4 - 84.4 %   Platelets 165 150 - 400 K/uL   nRBC 0.0 0.0 - 0.2 %   Neutrophils Relative % 67 %   Neutro Abs 5.6 1.7 - 7.7 K/uL   Lymphocytes Relative 19 %   Lymphs Abs 1.6 0.7 - 4.0 K/uL   Monocytes Relative 10 %   Monocytes Absolute 0.9 0.1 - 1.0 K/uL   Eosinophils Relative 3 %   Eosinophils Absolute 0.3 0.0 - 0.5 K/uL   Basophils Relative 1 %   Basophils Absolute 0.0 0.0 - 0.1 K/uL   Immature Granulocytes 0 %   Abs Immature Granulocytes 0.03 0.00 - 0.07 K/uL  Comprehensive metabolic panel     Status: Abnormal   Collection Time: 11/08/24  6:29 PM  Result Value Ref Range   Sodium 138 135 - 145 mmol/L   Potassium 4.2 3.5 - 5.1 mmol/L   Chloride 103 98 - 111 mmol/L   CO2 28 22 - 32 mmol/L   Glucose, Bld 110 (H) 70 - 99 mg/dL   BUN 24 (H) 8 - 23 mg/dL   Creatinine, Ser 8.76 0.61 - 1.24 mg/dL   Calcium  8.9 8.9 - 10.3 mg/dL   Total Protein 6.4 (L) 6.5 - 8.1 g/dL   Albumin 3.7 3.5 - 5.0 g/dL   AST 51 (H) 15 - 41 U/L   ALT 18 0 - 44 U/L   Alkaline Phosphatase 95 38 - 126 U/L   Total Bilirubin 0.6 0.0 - 1.2 mg/dL    GFR, Estimated 57 (L) >60 mL/min   Anion gap 7 5 - 15  Lipase, blood     Status: None   Collection Time: 11/08/24  6:29 PM  Result Value Ref Range   Lipase  34 11 - 51 U/L  Troponin T, High Sensitivity     Status: Abnormal   Collection Time: 11/08/24  6:29 PM  Result Value Ref Range   Troponin T High Sensitivity 258 (HH) 0 - 19 ng/L  Pro Brain natriuretic peptide     Status: Abnormal   Collection Time: 11/08/24  6:29 PM  Result Value Ref Range   Pro Brain Natriuretic Peptide 1,719.0 (H) <300.0 pg/mL  Protime-INR     Status: None   Collection Time: 11/08/24  6:29 PM  Result Value Ref Range   Prothrombin Time 13.7 11.4 - 15.2 seconds   INR 1.0 0.8 - 1.2  TSH     Status: None   Collection Time: 11/08/24  6:29 PM  Result Value Ref Range   TSH 3.310 0.350 - 4.500 uIU/mL  Lipid panel     Status: Abnormal   Collection Time: 11/08/24  6:29 PM  Result Value Ref Range   Cholesterol 152 0 - 200 mg/dL   Triglycerides 848 (H) <150 mg/dL   HDL 40 (L) >59 mg/dL   Total CHOL/HDL Ratio 3.8 RATIO   VLDL 30 0 - 40 mg/dL   LDL Cholesterol 82 0 - 99 mg/dL  I-stat chem 8, ED (not at [facility], [facility] or [facility])     Status: Abnormal   Collection Time: 11/08/24  6:57 PM  Result Value Ref Range   Sodium 141 135 - 145 mmol/L   Potassium 4.0 3.5 - 5.1 mmol/L   Chloride 104 98 - 111 mmol/L   BUN 26 (H) 8 - 23 mg/dL   Creatinine, Ser 8.69 (H) 0.61 - 1.24 mg/dL   Glucose, Bld 894 (H) 70 - 99 mg/dL   Calcium , Ion 1.22 1.15 - 1.40 mmol/L   TCO2 29 22 - 32 mmol/L   Hemoglobin 14.3 13.0 - 17.0 g/dL   HCT 57.9 60.9 - 47.9 %  Troponin T, High Sensitivity     Status: Abnormal   Collection Time: 11/08/24  8:41 PM  Result Value Ref Range   Troponin T High Sensitivity 515 (HH) 0 - 19 ng/L     Assessment and Plan: Elevated troponin currently 515 2. 2/10 chest pain persistent for greater than 24 hours 3. Malignant hypertension - His blood pressure improved after 1 dose of IV hydralazine  and 1 oral dose of  propranolol . - His current blood pressure is 157/95, HR=55, down from 200/100.  Usually his blood pressure at 157/95 would be an acceptable goal given how high he started, but because he is continuing to have the chest discomfort would consider a nitroglycerin  drip for tighter blood pressure control. He takes Cozaar  and Propranolol  as an outpatient. - Continue these as tolerated. - The patient is currently on IV heparin  per protocol - No additional beta-blocker because his heart rate is 65 - Cardiology has ordered an echocardiogram and CT - Continue to cycle troponins  4. OSA - Continue CPAP  5.  Hyperlipidemia - Continue pravastatin     Advance Care Planning:   Code Status: Not on file the patient names his wife as a surrogate decision maker and he wants to be DNR.  He says he is 59 does not need to be on life support.  Consults: Cardiology  Family Communication: None  Severity of Illness: The appropriate patient status for this patient is INPATIENT. Inpatient status is judged to be reasonable and necessary in order to provide the required intensity of service to ensure the patient's safety. The patient's presenting symptoms,  physical exam findings, and initial radiographic and laboratory data in the context of their chronic comorbidities is felt to place them at high risk for further clinical deterioration. Furthermore, it is not anticipated that the patient will be medically stable for discharge from the hospital within 2 midnights of admission.   * I certify that at the point of admission it is my clinical judgment that the patient will require inpatient hospital care spanning beyond 2 midnights from the point of admission due to high intensity of service, high risk for further deterioration and high frequency of surveillance required.*  Author: ARTHEA CHILD, MD 11/09/2024 12:10 AM  For on call review [email or web address].      [1]  Allergies Allergen Reactions   Sulfa  Antibiotics Rash and Dermatitis   "

## 2024-11-09 NOTE — ED Notes (Signed)
 Patient continues to deny CP, BP is consisently SBP less than 150 nitroglycerin  drip not started with hold in MAR.

## 2024-11-09 NOTE — Progress Notes (Signed)
 PHARMACY - ANTICOAGULATION CONSULT NOTE  Pharmacy Consult for IV heparin  Indication: chest pain/ACS  Allergies[1]  Patient Measurements: Height: 5' 10 (177.8 cm) Weight: 89.8 kg (198 lb) IBW/kg (Calculated) : 73 HEPARIN  DW (KG): 89.8  Vital Signs: Temp: 97.9 F (36.6 C) (01/28 0746) Temp Source: Oral (01/28 0746) BP: 152/74 (01/28 0749) Pulse Rate: 53 (01/28 0749)  Labs: Recent Labs    11/08/24 1829 11/08/24 1857 11/09/24 0145 11/09/24 0751  HGB 14.7 14.3 14.2  --   HCT 43.7 42.0 41.9  --   PLT 165  --  148*  --   LABPROT 13.7  --   --   --   INR 1.0  --   --   --   HEPARINUNFRC  --   --   --  0.67  CREATININE 1.23 1.30* 1.02  --     Estimated Creatinine Clearance: 57.5 mL/min (by C-G formula based on SCr of 1.02 mg/dL).   Medical History: Past Medical History:  Diagnosis Date   Environmental allergies    Hypertension    Sleep apnea     Medications:  (Not in a hospital admission)   Assessment: Jeremy Finley is a 89 y.o. year old male admitted on 11/08/2024 with CP concern for NSTEMI. No anticoagulation prior to admission. Pharmacy consulted to dose heparin .  Heparin  level 0.67, therapeutic No issues with infusion or bleeding noted.   Goal of Therapy:  Heparin  level 0.3-0.7 units/ml Monitor platelets by anticoagulation protocol: Yes   Plan:  Continue heparin  infusion at 1000 units/hr Daily heparin  level, CBC, and monitoring for bleeding F/u plans for anticoagulation per cards - LHC planned for today  Thank you for allowing pharmacy to participate in this patient's care.  Leonor GORMAN Bash, PharmD Emergency Medicine Clinical Pharmacist 11/09/2024,8:17 AM        [1]  Allergies Allergen Reactions   Sulfa Antibiotics Rash and Dermatitis

## 2024-11-09 NOTE — Progress Notes (Signed)
 " PROGRESS NOTE    Jeremy Finley  FMW:[id number] DOB: [birth year] DOA: 11/08/2024 PCP: Charlott Dorn LABOR, MD    Brief Narrative:  89 year old with history of OSA on CPAP, HTN, HLD admitted for chest pain and elevated blood pressure.  Originally thought to be STEMI later canceled by cardiology due to no evidence.  Admitted to the hospital for hypertensive urgency/NSTEMI.   Assessment & Plan:  Hypertensive urgency with history of essential hypertension Chest pain, NSTEMI Elevated troponin - Troponins continue to trend up 258 > 1151.  EKG not showing any active ischemic changes.  Will continue to trend until it peaks.  Echocardiogram ordered - Heparin  drip, nitroglycerin  drip - Patient is currently n.p.o. for ischemic evaluation - CTA chest is negative for PE but does have four-vessel disease - Plans for LHC today - LDL 82, TSH normal, check J8r  OSA on CPAP  Hyperlipidemia - LDL 82.  Lipitor  Mild transaminitis -AST > ALT   DVT prophylaxis: Heparin  drip    Code Status: Limited: Do not attempt resuscitation (DNR) -DNR-LIMITED -Do Not Intubate/DNI  Family Communication: Daughter at bedside Status is: Inpatient Remains inpatient appropriate because: Ongoing ischemic evaluation   PT Follow up Recs:   Subjective: No chest pain during my visit.  Waiting left heart cath   Examination:  General exam: Appears calm and comfortable  Respiratory system: Clear to auscultation. Respiratory effort normal. Cardiovascular system: S1 & S2 heard, RRR. No JVD, murmurs, rubs, gallops or clicks. No pedal edema. Gastrointestinal system: Abdomen is nondistended, soft and nontender. No organomegaly or masses felt. Normal bowel sounds heard. Central nervous system: Alert and oriented. No focal neurological deficits. Extremities: Symmetric 5 x 5 power. Skin: No rashes, lesions or ulcers Psychiatry: Judgement and insight appear normal. Mood & affect appropriate.                 Diet Orders (From admission, onward)     Start     Ordered   11/09/24 0039  Diet NPO time specified  Diet effective now        11/09/24 0040            Objective: Vitals:   11/09/24 0746 11/09/24 0749 11/09/24 0815 11/09/24 1000  BP:  (!) 152/74 (!) 156/66 (!) 159/64  Pulse:  (!) 53 (!) 49 (!) 51  Resp:  15 12 18   Temp: 97.9 F (36.6 C)     TempSrc: Oral     SpO2:  100% 100% 100%  Weight:      Height:       No intake or output data in the 24 hours ending 11/09/24 1114 Filed Weights   11/08/24 2200 11/09/24 0248  Weight: 83.9 kg 89.8 kg    Scheduled Meds:  amLODipine   5 mg Oral Daily   [START ON 11/10/2024] aspirin   81 mg Oral Pre-Cath   aspirin  EC  81 mg Oral Daily   atorvastatin   80 mg Oral Daily   busPIRone   5 mg Oral BID   escitalopram   20 mg Oral Daily   free water   500 mL Oral Once   losartan   100 mg Oral q AM   sodium chloride  flush  3 mL Intravenous Q12H   Continuous Infusions:  heparin  1,000 Units/hr (11/09/24 0908)   nitroGLYCERIN  Stopped (11/09/24 9350)    Nutritional status     Body mass index is 28.41 kg/m.  Data Reviewed:   CBC: Recent Labs  Lab 11/08/24 1829 11/08/24 1857 11/09/24 0145  WBC 8.4  --  12.5*  NEUTROABS 5.6  --   --   HGB 14.7 14.3 14.2  HCT 43.7 42.0 41.9  MCV 95.8  --  95.0  PLT 165  --  148*   Basic Metabolic Panel: Recent Labs  Lab 11/08/24 1829 11/08/24 1857 11/09/24 0145  NA 138 141 137  K 4.2 4.0 3.9  CL 103 104 103  CO2 28  --  24  GLUCOSE 110* 105* 95  BUN 24* 26* 20  CREATININE 1.23 1.30* 1.02  CALCIUM  8.9  --  8.7*   GFR: Estimated Creatinine Clearance: 57.5 mL/min (by C-G formula based on SCr of 1.02 mg/dL). Liver Function Tests: Recent Labs  Lab 11/08/24 1829 11/09/24 0145  AST 51* 95*  ALT 18 20  ALKPHOS 95 90  BILITOT 0.6 0.8  PROT 6.4* 6.1*  ALBUMIN 3.7 3.4*   Recent Labs  Lab 11/08/24 1829  LIPASE 34   No results for input(s): AMMONIA in the last 168  hours. Coagulation Profile: Recent Labs  Lab 11/08/24 1829  INR 1.0   Cardiac Enzymes: No results for input(s): CKTOTAL, CKMB, CKMBINDEX, TROPONINI in the last 168 hours. BNP (last 3 results) Recent Labs    11/08/24 1829  PROBNP 1,719.0*   HbA1C: Recent Labs    11/09/24 0145  HGBA1C 5.4   CBG: No results for input(s): GLUCAP in the last 168 hours. Lipid Profile: Recent Labs    11/08/24 1829  CHOL 152  HDL 40*  LDLCALC 82  TRIG 848*  CHOLHDL 3.8   Thyroid Function Tests: Recent Labs    11/09/24 0145  TSH 4.230   Anemia Panel: No results for input(s): VITAMINB12, FOLATE, FERRITIN, TIBC, IRON, RETICCTPCT in the last 72 hours. Sepsis Labs: No results for input(s): PROCALCITON, LATICACIDVEN in the last 168 hours.  No results found for this or any previous visit (from the past 240 hours).       Radiology Studies: CT Angio Chest/Abd/Pel for Dissection W and/or Wo Contrast Result Date: 11/08/2024 EXAM: CTA CHEST 11/08/2024 07:58:00 PM TECHNIQUE: CTA of the chest was performed without and with the administration of 85 mL of intravenous iohexol  (OMNIPAQUE ) 350 MG/ML injection. Multiplanar reformatted images are provided for review. MIP images are provided for review. Automated exposure control, iterative reconstruction, and/or weight based adjustment of the mA/kV was utilized to reduce the radiation dose to as low as reasonably achievable. COMPARISON: None available. CLINICAL HISTORY: Blood pressure 200, new chest pain. STEMI and route but canceled on arrival. Rule out dissection for chest pain. FINDINGS: PULMONARY ARTERIES: Pulmonary arteries are adequately opacified for evaluation. No acute pulmonary embolus. Main pulmonary artery is normal in caliber. MEDIASTINUM: Enlarged right atrium. 4-vessel coronary artery calcification. The thoracic aorta is normal in caliber. No thoracic aorta dissection. Mild atherosclerotic plaque of the aorta. The  esophagus is unremarkable. The thyroid gland is unremarkable. The pericardium demonstrates no acute abnormality. LYMPH NODES: Associated calcified right hilar lymph nodes. No mediastinal or axillary lymphadenopathy. LUNGS AND PLEURA: Multiple scattered pulmonary micronodules, calcified and noncalcified, within the right lung. Associated calcified right hilar lymph nodes. Findings suggestive of prior granulomatous disease. Largest nodule measure up to 5 mm within the right lower lobe (6:131). No focal consolidation or pulmonary edema. No evidence of pleural effusion or pneumothorax. UPPER ABDOMEN: The abdominal aorta is normal in caliber. No abdominal aorta dissection. Tiny hiatal hernia. Fluid-density lesions of the kidneys likely represent simple renal cysts. Simple renal cysts do not require additional follow-up unless  clinically indicated due to signs/symptoms. No small or large bowel thickening or dilatation. The appendix is unremarkable. Colonic diverticulosis. The prostate is enlarged, measuring up to 5.5 cm. Small fat-containing right inguinal hernia. SOFT TISSUES AND BONES: Multilevel moderate severity degenerative changes of the spine. No acute displaced fracture. No suspicious lytic or blastic osseous lesions. Diffusely decreased bone density. No acute soft tissue abnormality. IMPRESSION: 1. No thoracic or abdominal aorta dissection. 2. Atherosclerotic plaque with 4-vessel coronary artery calcifications. 3. 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 4. Other, non-acute and/or normal findings as above. Electronically signed by: Morgane Naveau MD 11/08/2024 08:11 PM EST RP Workstation: [id number]           LOS: 0 days   Time spent= 35 mins    Burgess JAYSON Dare, MD [facility]  If 7PM-7AM, please contact night-coverage  11/09/2024, 11:14 AM  "

## 2024-11-09 NOTE — Progress Notes (Signed)
 "  Rounding Note   Patient Name: Jeremy Finley Date of Encounter: 11/09/2024  [facility] Cardiologist: None New  Subjective Notes mild chest pressure and sense of unease throughout the night.   Scheduled Meds:  amLODipine   5 mg Oral Daily   aspirin  EC  81 mg Oral Daily   atorvastatin   80 mg Oral Daily   busPIRone   5 mg Oral BID   escitalopram   20 mg Oral Daily   losartan   100 mg Oral q AM   sodium chloride  flush  3 mL Intravenous Q12H   Continuous Infusions:  heparin  1,000 Units/hr (11/09/24 0004)   nitroGLYCERIN  Stopped (11/09/24 0649)   PRN Meds: acetaminophen  **OR** acetaminophen , bisacodyl , guaiFENesin , hydrALAZINE , ipratropium-albuterol , metoprolol  tartrate, ondansetron  **OR** ondansetron  (ZOFRAN ) IV   Vital Signs  Vitals:   11/09/24 0630 11/09/24 0742 11/09/24 0746 11/09/24 0749  BP: 139/66   (!) 152/74  Pulse: (!) 53   (!) 53  Resp: 17   15  Temp:   97.9 F (36.6 C)   TempSrc:  Oral Oral   SpO2: 97%   100%  Weight:      Height:       No intake or output data in the 24 hours ending 11/09/24 0755    11/09/2024    2:48 AM 11/08/2024   10:00 PM 03/26/2023   10:24 AM  Last 3 Weights  Weight (lbs) 198 lb 185 lb 194 lb 9.6 oz  Weight (kg) 89.812 kg 83.915 kg 88.27 kg      Telemetry Sinus brady HR 45-55 - Personally Reviewed  ECG  Sinus brady - normal - Personally Reviewed  Physical Exam  GEN: No acute distress.   Neck: No JVD Cardiac: RRR, no murmurs, rubs, or gallops.  Respiratory: Clear to auscultation bilaterally. GI: Soft, nontender, non-distended  MS: No edema; No deformity. Goof pulses Neuro:  Nonfocal  Psych: Normal affect   Labs High Sensitivity Troponin:  No results for input(s): TROPONINIHS in the last 720 hours.  Recent Labs  Lab 11/08/24 1829 11/08/24 2041 11/09/24 0145  TRNPT 258* 515* 1,151*       Chemistry Recent Labs  Lab 11/08/24 1829 11/08/24 1857 11/09/24 0145  NA 138 141 137  K 4.2 4.0 3.9  CL 103  104 103  CO2 28  --  24  GLUCOSE 110* 105* 95  BUN 24* 26* 20  CREATININE 1.23 1.30* 1.02  CALCIUM  8.9  --  8.7*  PROT 6.4*  --  6.1*  ALBUMIN 3.7  --  3.4*  AST 51*  --  95*  ALT 18  --  20  ALKPHOS 95  --  90  BILITOT 0.6  --  0.8  GFRNONAA 57*  --  >60  ANIONGAP 7  --  10    Lipids  Recent Labs  Lab 11/08/24 1829  CHOL 152  TRIG 151*  HDL 40*  LDLCALC 82  CHOLHDL 3.8    Hematology Recent Labs  Lab 11/08/24 1829 11/08/24 1857 11/09/24 0145  WBC 8.4  --  12.5*  RBC 4.56  --  4.41  HGB 14.7 14.3 14.2  HCT 43.7 42.0 41.9  MCV 95.8  --  95.0  MCH 32.2  --  32.2  MCHC 33.6  --  33.9  RDW 12.1  --  12.1  PLT 165  --  148*   Thyroid  Recent Labs  Lab 11/09/24 0145  TSH 4.230    BNP Recent Labs  Lab 11/08/24  1829  PROBNP 1,719.0*    DDimer No results for input(s): DDIMER in the last 168 hours.   Radiology  CT Angio Chest/Abd/Pel for Dissection W and/or Wo Contrast Result Date: 11/08/2024 EXAM: CTA CHEST 11/08/2024 07:58:00 PM TECHNIQUE: CTA of the chest was performed without and with the administration of 85 mL of intravenous iohexol  (OMNIPAQUE ) 350 MG/ML injection. Multiplanar reformatted images are provided for review. MIP images are provided for review. Automated exposure control, iterative reconstruction, and/or weight based adjustment of the mA/kV was utilized to reduce the radiation dose to as low as reasonably achievable. COMPARISON: None available. CLINICAL HISTORY: Blood pressure 200, new chest pain. STEMI and route but canceled on arrival. Rule out dissection for chest pain. FINDINGS: PULMONARY ARTERIES: Pulmonary arteries are adequately opacified for evaluation. No acute pulmonary embolus. Main pulmonary artery is normal in caliber. MEDIASTINUM: Enlarged right atrium. 4-vessel coronary artery calcification. The thoracic aorta is normal in caliber. No thoracic aorta dissection. Mild atherosclerotic plaque of the aorta. The esophagus is unremarkable. The  thyroid gland is unremarkable. The pericardium demonstrates no acute abnormality. LYMPH NODES: Associated calcified right hilar lymph nodes. No mediastinal or axillary lymphadenopathy. LUNGS AND PLEURA: Multiple scattered pulmonary micronodules, calcified and noncalcified, within the right lung. Associated calcified right hilar lymph nodes. Findings suggestive of prior granulomatous disease. Largest nodule measure up to 5 mm within the right lower lobe (6:131). No focal consolidation or pulmonary edema. No evidence of pleural effusion or pneumothorax. UPPER ABDOMEN: The abdominal aorta is normal in caliber. No abdominal aorta dissection. Tiny hiatal hernia. Fluid-density lesions of the kidneys likely represent simple renal cysts. Simple renal cysts do not require additional follow-up unless clinically indicated due to signs/symptoms. No small or large bowel thickening or dilatation. The appendix is unremarkable. Colonic diverticulosis. The prostate is enlarged, measuring up to 5.5 cm. Small fat-containing right inguinal hernia. SOFT TISSUES AND BONES: Multilevel moderate severity degenerative changes of the spine. No acute displaced fracture. No suspicious lytic or blastic osseous lesions. Diffusely decreased bone density. No acute soft tissue abnormality. IMPRESSION: 1. No thoracic or abdominal aorta dissection. 2. Atherosclerotic plaque with 4-vessel coronary artery calcifications. 3. 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 4. Other, non-acute and/or normal findings as above. Electronically signed by: Morgane Naveau MD 11/08/2024 08:11 PM EST RP Workstation: [id number]    Cardiac Studies none  Patient Profile   89 y.o. male with history of HTN, HLD, OSA presents with acute chest pain and NSTEMI  Assessment & Plan  NSTEMI. Ecg normalized. Troponin up to 1151. Mild ongoing chest pressure. On IV heparin , ASA. Was severely hypertensive on arrival. BP improved. CT chest showed significant coronary  calcification. I suspect this is ACS and not just demand ischemia from HTN. Check Echo. Patient is very active 89 yo - [place] [facility] [employer] [facility] [place] and plays tennis. I would recommend further evaluation with cardiac cath not coronary CTA. Discussed fully with patient. Will schedule today. The procedure and risks were reviewed including but not limited to death, myocardial infarction, stroke, arrythmias, bleeding, transfusion, emergency surgery, dye allergy, or renal dysfunction. The patient voices understanding and is agreeable to proceed. HTN. Resumed losartan . Will hold propranolol  due to marked sinus brady. Patient reports even with exercise HR only gets up in 60s. Will add amlodipine .  HLD. LDL 82 on pravastatin . Now on lipitor 80 mg  Sleep apnea.     For questions or updates, please contact [facility] Please consult [email or web address] for contact info  under       Signed, Bence Trapp, MD  11/09/2024, 7:55 AM    "

## 2024-11-09 NOTE — ED Notes (Addendum)
 Epic secure chat received from Will Bernard MD ordered a dose of Morphine  to see if that will relieve his 2/10 cp. The Nitro drip is prn if the morphine  does not help the cp or bp

## 2024-11-09 NOTE — Interval H&P Note (Signed)
 History and Physical Interval Note:  11/09/2024 3:10 PM  Jeremy Finley  has presented today for surgery, with the diagnosis of nstemi.  The various methods of treatment have been discussed with the patient and family. After consideration of risks, benefits and other options for treatment, the patient has consented to  Procedures: LEFT HEART CATH AND CORONARY ANGIOGRAPHY (N/A) and possible coronary angioplasty for NSTEMI as a surgical intervention.  The patient's history has been reviewed, patient examined, no change in status, stable for surgery.  I have reviewed the patient's chart and labs.  Questions were answered to the patient's satisfaction.     Gordy Bergamo

## 2024-11-09 NOTE — ED Notes (Addendum)
 Troponin 1151 reported to Dr. Arthea with and Dr. Franky via [facility] Chat. Patient denies chest pain at this time, New EKG exported to patient chart.

## 2024-11-09 NOTE — H&P (View-Only) (Signed)
 "  Rounding Note   Patient Name: Jeremy Finley Date of Encounter: 11/09/2024  [facility] Cardiologist: None New  Subjective Notes mild chest pressure and sense of unease throughout the night.   Scheduled Meds:  amLODipine   5 mg Oral Daily   aspirin  EC  81 mg Oral Daily   atorvastatin   80 mg Oral Daily   busPIRone   5 mg Oral BID   escitalopram   20 mg Oral Daily   losartan   100 mg Oral q AM   sodium chloride  flush  3 mL Intravenous Q12H   Continuous Infusions:  heparin  1,000 Units/hr (11/09/24 0004)   nitroGLYCERIN  Stopped (11/09/24 0649)   PRN Meds: acetaminophen  **OR** acetaminophen , bisacodyl , guaiFENesin , hydrALAZINE , ipratropium-albuterol , metoprolol  tartrate, ondansetron  **OR** ondansetron  (ZOFRAN ) IV   Vital Signs  Vitals:   11/09/24 0630 11/09/24 0742 11/09/24 0746 11/09/24 0749  BP: 139/66   (!) 152/74  Pulse: (!) 53   (!) 53  Resp: 17   15  Temp:   97.9 F (36.6 C)   TempSrc:  Oral Oral   SpO2: 97%   100%  Weight:      Height:       No intake or output data in the 24 hours ending 11/09/24 0755    11/09/2024    2:48 AM 11/08/2024   10:00 PM 03/26/2023   10:24 AM  Last 3 Weights  Weight (lbs) 198 lb 185 lb 194 lb 9.6 oz  Weight (kg) 89.812 kg 83.915 kg 88.27 kg      Telemetry Sinus brady HR 45-55 - Personally Reviewed  ECG  Sinus brady - normal - Personally Reviewed  Physical Exam  GEN: No acute distress.   Neck: No JVD Cardiac: RRR, no murmurs, rubs, or gallops.  Respiratory: Clear to auscultation bilaterally. GI: Soft, nontender, non-distended  MS: No edema; No deformity. Goof pulses Neuro:  Nonfocal  Psych: Normal affect   Labs High Sensitivity Troponin:  No results for input(s): TROPONINIHS in the last 720 hours.  Recent Labs  Lab 11/08/24 1829 11/08/24 2041 11/09/24 0145  TRNPT 258* 515* 1,151*       Chemistry Recent Labs  Lab 11/08/24 1829 11/08/24 1857 11/09/24 0145  NA 138 141 137  K 4.2 4.0 3.9  CL 103  104 103  CO2 28  --  24  GLUCOSE 110* 105* 95  BUN 24* 26* 20  CREATININE 1.23 1.30* 1.02  CALCIUM  8.9  --  8.7*  PROT 6.4*  --  6.1*  ALBUMIN 3.7  --  3.4*  AST 51*  --  95*  ALT 18  --  20  ALKPHOS 95  --  90  BILITOT 0.6  --  0.8  GFRNONAA 57*  --  >60  ANIONGAP 7  --  10    Lipids  Recent Labs  Lab 11/08/24 1829  CHOL 152  TRIG 151*  HDL 40*  LDLCALC 82  CHOLHDL 3.8    Hematology Recent Labs  Lab 11/08/24 1829 11/08/24 1857 11/09/24 0145  WBC 8.4  --  12.5*  RBC 4.56  --  4.41  HGB 14.7 14.3 14.2  HCT 43.7 42.0 41.9  MCV 95.8  --  95.0  MCH 32.2  --  32.2  MCHC 33.6  --  33.9  RDW 12.1  --  12.1  PLT 165  --  148*   Thyroid  Recent Labs  Lab 11/09/24 0145  TSH 4.230    BNP Recent Labs  Lab 11/08/24  1829  PROBNP 1,719.0*    DDimer No results for input(s): DDIMER in the last 168 hours.   Radiology  CT Angio Chest/Abd/Pel for Dissection W and/or Wo Contrast Result Date: 11/08/2024 EXAM: CTA CHEST 11/08/2024 07:58:00 PM TECHNIQUE: CTA of the chest was performed without and with the administration of 85 mL of intravenous iohexol  (OMNIPAQUE ) 350 MG/ML injection. Multiplanar reformatted images are provided for review. MIP images are provided for review. Automated exposure control, iterative reconstruction, and/or weight based adjustment of the mA/kV was utilized to reduce the radiation dose to as low as reasonably achievable. COMPARISON: None available. CLINICAL HISTORY: Blood pressure 200, new chest pain. STEMI and route but canceled on arrival. Rule out dissection for chest pain. FINDINGS: PULMONARY ARTERIES: Pulmonary arteries are adequately opacified for evaluation. No acute pulmonary embolus. Main pulmonary artery is normal in caliber. MEDIASTINUM: Enlarged right atrium. 4-vessel coronary artery calcification. The thoracic aorta is normal in caliber. No thoracic aorta dissection. Mild atherosclerotic plaque of the aorta. The esophagus is unremarkable. The  thyroid gland is unremarkable. The pericardium demonstrates no acute abnormality. LYMPH NODES: Associated calcified right hilar lymph nodes. No mediastinal or axillary lymphadenopathy. LUNGS AND PLEURA: Multiple scattered pulmonary micronodules, calcified and noncalcified, within the right lung. Associated calcified right hilar lymph nodes. Findings suggestive of prior granulomatous disease. Largest nodule measure up to 5 mm within the right lower lobe (6:131). No focal consolidation or pulmonary edema. No evidence of pleural effusion or pneumothorax. UPPER ABDOMEN: The abdominal aorta is normal in caliber. No abdominal aorta dissection. Tiny hiatal hernia. Fluid-density lesions of the kidneys likely represent simple renal cysts. Simple renal cysts do not require additional follow-up unless clinically indicated due to signs/symptoms. No small or large bowel thickening or dilatation. The appendix is unremarkable. Colonic diverticulosis. The prostate is enlarged, measuring up to 5.5 cm. Small fat-containing right inguinal hernia. SOFT TISSUES AND BONES: Multilevel moderate severity degenerative changes of the spine. No acute displaced fracture. No suspicious lytic or blastic osseous lesions. Diffusely decreased bone density. No acute soft tissue abnormality. IMPRESSION: 1. No thoracic or abdominal aorta dissection. 2. Atherosclerotic plaque with 4-vessel coronary artery calcifications. 3. 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 4. Other, non-acute and/or normal findings as above. Electronically signed by: Morgane Naveau MD 11/08/2024 08:11 PM EST RP Workstation: [id number]    Cardiac Studies none  Patient Profile   89 y.o. male with history of HTN, HLD, OSA presents with acute chest pain and NSTEMI  Assessment & Plan  NSTEMI. Ecg normalized. Troponin up to 1151. Mild ongoing chest pressure. On IV heparin , ASA. Was severely hypertensive on arrival. BP improved. CT chest showed significant coronary  calcification. I suspect this is ACS and not just demand ischemia from HTN. Check Echo. Patient is very active 89 yo - [facility] [employer] [facility] a week and plays tennis. I would recommend further evaluation with cardiac cath not coronary CTA. Discussed fully with patient. Will schedule today. The procedure and risks were reviewed including but not limited to death, myocardial infarction, stroke, arrythmias, bleeding, transfusion, emergency surgery, dye allergy, or renal dysfunction. The patient voices understanding and is agreeable to proceed. HTN. Resumed losartan . Will hold propranolol  due to marked sinus brady. Patient reports even with exercise HR only gets up in 60s. Will add amlodipine .  HLD. LDL 82 on pravastatin . Now on lipitor 80 mg  Sleep apnea.     For questions or updates, please contact [facility] Please consult [email or web address] for contact info  under       Signed, Bence Trapp, MD  11/09/2024, 7:55 AM    "

## 2024-11-09 NOTE — Progress Notes (Signed)
 Pt troponin 1186. Burgess Dare, MD notified. Pt reporting no chest pain at this time.

## 2024-11-10 ENCOUNTER — Other Ambulatory Visit (HOSPITAL_COMMUNITY): Payer: Self-pay

## 2024-11-10 ENCOUNTER — Encounter (HOSPITAL_COMMUNITY): Payer: Self-pay | Admitting: Cardiology

## 2024-11-10 ENCOUNTER — Telehealth (HOSPITAL_COMMUNITY): Payer: Self-pay | Admitting: Pharmacy Technician

## 2024-11-10 LAB — HEPATIC FUNCTION PANEL
ALT: 17 U/L (ref 0–44)
AST: 68 U/L — ABNORMAL HIGH (ref 15–41)
Albumin: 3.4 g/dL — ABNORMAL LOW (ref 3.5–5.0)
Alkaline Phosphatase: 92 U/L (ref 38–126)
Bilirubin, Direct: 0.5 mg/dL — ABNORMAL HIGH (ref 0.0–0.2)
Indirect Bilirubin: 0.9 mg/dL (ref 0.3–0.9)
Total Bilirubin: 1.3 mg/dL — ABNORMAL HIGH (ref 0.0–1.2)
Total Protein: 5.8 g/dL — ABNORMAL LOW (ref 6.5–8.1)

## 2024-11-10 LAB — GLUCOSE, CAPILLARY
Glucose-Capillary: 106 mg/dL — ABNORMAL HIGH (ref 70–99)
Glucose-Capillary: 97 mg/dL (ref 70–99)

## 2024-11-10 LAB — MAGNESIUM: Magnesium: 2 mg/dL (ref 1.7–2.4)

## 2024-11-10 LAB — CBC
HCT: 39.2 % (ref 39.0–52.0)
Hemoglobin: 13.4 g/dL (ref 13.0–17.0)
MCH: 32.2 pg (ref 26.0–34.0)
MCHC: 34.2 g/dL (ref 30.0–36.0)
MCV: 94.2 fL (ref 80.0–100.0)
Platelets: 144 10*3/uL — ABNORMAL LOW (ref 150–400)
RBC: 4.16 MIL/uL — ABNORMAL LOW (ref 4.22–5.81)
RDW: 12.3 % (ref 11.5–15.5)
WBC: 9.9 10*3/uL (ref 4.0–10.5)
nRBC: 0 % (ref 0.0–0.2)

## 2024-11-10 LAB — BASIC METABOLIC PANEL WITH GFR
Anion gap: 9 (ref 5–15)
BUN: 17 mg/dL (ref 8–23)
CO2: 25 mmol/L (ref 22–32)
Calcium: 8.9 mg/dL (ref 8.9–10.3)
Chloride: 102 mmol/L (ref 98–111)
Creatinine, Ser: 1.16 mg/dL (ref 0.61–1.24)
GFR, Estimated: >60 mL/min
Glucose, Bld: 95 mg/dL (ref 70–99)
Potassium: 4.5 mmol/L (ref 3.5–5.1)
Sodium: 137 mmol/L (ref 135–145)

## 2024-11-10 LAB — PHOSPHORUS: Phosphorus: 2.7 mg/dL (ref 2.5–4.6)

## 2024-11-10 MED ORDER — AMLODIPINE BESYLATE 5 MG PO TABS
5.0000 mg | ORAL_TABLET | Freq: Every day | ORAL | 0 refills | Status: AC
  Filled 2024-11-10: qty 90, 90d supply, fill #0

## 2024-11-10 MED ORDER — TICAGRELOR 90 MG PO TABS
90.0000 mg | ORAL_TABLET | Freq: Two times a day (BID) | ORAL | Status: DC
  Administered 2024-11-10: 90 mg via ORAL
  Filled 2024-11-10: qty 1

## 2024-11-10 MED ORDER — ASPIRIN 81 MG PO CHEW
81.0000 mg | CHEWABLE_TABLET | Freq: Every day | ORAL | 0 refills | Status: AC
  Filled 2024-11-10: qty 90, 90d supply, fill #0

## 2024-11-10 MED ORDER — NITROGLYCERIN 0.4 MG SL SUBL
0.4000 mg | SUBLINGUAL_TABLET | SUBLINGUAL | 0 refills | Status: AC | PRN
  Filled 2024-11-10: qty 100, 36d supply, fill #0

## 2024-11-10 MED ORDER — TICAGRELOR 90 MG PO TABS
90.0000 mg | ORAL_TABLET | Freq: Two times a day (BID) | ORAL | 0 refills | Status: AC
  Filled 2024-11-10: qty 60, 30d supply, fill #0

## 2024-11-10 MED ORDER — ATORVASTATIN CALCIUM 80 MG PO TABS
80.0000 mg | ORAL_TABLET | Freq: Every day | ORAL | 0 refills | Status: AC
  Filled 2024-11-10: qty 90, 90d supply, fill #0

## 2024-11-10 NOTE — Progress Notes (Signed)
"  °  Progress Note  Patient Name: Jeremy Finley Date of Encounter: 11/10/2024 [facility] Cardiologist: Peter Jordan, MD   Interval Summary    Feeling well this morning, no chest pain overnight.   Vital Signs Vitals:   11/09/24 1657 11/09/24 2034 11/10/24 0006 11/10/24 0435  BP: 134/77 121/62 135/63 131/65  Pulse: 60 72 66 64  Resp:  18 19 18   Temp:  99.6 F (37.6 C) 99.2 F (37.3 C)   TempSrc:  Oral Oral   SpO2: 97% 94% 99% 96%  Weight:      Height:        Intake/Output Summary (Last 24 hours) at 11/10/2024 0715 Last data filed at 11/09/2024 2300 Gross per 24 hour  Intake 1500 ml  Output 160 ml  Net 1340 ml      11/09/2024   12:00 PM 11/09/2024    2:48 AM 11/08/2024   10:00 PM  Last 3 Weights  Weight (lbs) 186 lb 9.6 oz 198 lb 185 lb  Weight (kg) 84.641 kg 89.812 kg 83.915 kg      Telemetry/ECG   Sinus Rhythm - Personally Reviewed  Physical Exam  GEN: No acute distress.   Neck: No JVD Cardiac: RRR, no murmurs, rubs, or gallops.  Respiratory: Clear to auscultation bilaterally. GI: Soft, nontender, non-distended  MS: No edema VAS: right radial cath site stable  Assessment & Plan   89 y.o. male with a hx of Chronic kidney disease, HTN, HLD, obstructive sleep apnea who was seen 11/08/2024 for the evaluation of chest pain at the request of the emergency department.   NSTEMI -- High-sensitivity troponin peaked at 1191.  Underwent cardiac catheterization 1/28 with PCI/DES to distal RCA for 99% focal napkin ring like stenosis.  Residual disease of 60% and OM 3 and 20% proximal LAD to be treated medically.  Recommendations for DAPT with aspirin /Brilinta  for at least 1 year. -- Echo with LVEF of 55 to 60%, grade 1 diastolic dysfunction, normal RV, trivial MR. -- Continue aspirin , Brilinta , atorvastatin  80 mg daily  Hypertension -- Well-controlled --Continue amlodipine  5 mg daily, losartan  100 mg daily  Bradycardia -- propranolol  held in the setting of  bradycardia, now resolved   Hyperlipidemia -- LDL 82, HDL 40, LP(a) pending -- continue atorvastatin  80mg  daily  Will arrange outpatient follow up  For questions or updates, please contact [facility] Please consult [email or web address] for contact info under         Signed, Manuelita Rummer, NP   "

## 2024-11-10 NOTE — Progress Notes (Addendum)
 CARDIAC REHAB PHASE I   Post MI/stent education including restrictions, risk factors, exercise guidelines, antiplatelet therapy importance, MI booklet, NTG use, heart healthy diet, and CRP2 reviewed. All questions and concerns addressed. Will refer to [facility] for CRP2. Patient is active and attends [employer] twice weekly. Patient prefers to get dressed before ambulation at this time. Will continue to follow.    [id number] Jeremy JAYSON [place], RN BSN 11/10/2024 9:17 AM

## 2024-11-10 NOTE — Discharge Summary (Signed)
 Physician Discharge Summary  Jeremy Finley FMW:[id number] DOB: [birth year] DOA: 11/08/2024  PCP: Charlott Dorn LABOR, MD  Admit date: 11/08/2024 Discharge date: 11/10/2024  Admitted From: Home Disposition: Home  Recommendations for Outpatient Follow-up:  Follow up with PCP in 1-2 weeks Please obtain BMP/CBC in one week your next doctors visit.  Aspirin  and Brilinta  for minimum 1 year.  Lipitor 80 mg daily Nitrostat  as needed Outpatient follow-up with cardiology to be arranged by their service   Discharge Condition: Stable CODE STATUS: DNR Diet recommendation: Heart healthy  Brief/Interim Summary: Brief Narrative:  89 year old with history of OSA on CPAP, HTN, HLD admitted for chest pain and elevated blood pressure.  Originally thought to be STEMI later canceled by cardiology due to no evidence.  Admitted to the hospital for hypertensive urgency/NSTEMI.  Initially patient was started on heparin  and nitroglycerin  drip.  Underwent left heart catheterization on 1/21 requiring PCI/DES to distal RCA.  Thereafter placed on aspirin , Brilinta  and statin.  Is cleared for discharge.  Son is present at bedside   Assessment & Plan:  Hypertensive urgency with history of essential hypertension Chest pain, NSTEMI Elevated troponin - Troponins continue to trend up 258 > 1151.  Fortunately EKG did not show any acute ischemic changes.  Echocardiogram overall showed preserved ejection fraction with grade 1 DD.  Underwent left heart catheterization on 1/28 showing somewhat multivessel disease which was medically treated and underwent PCI/DES to his distal right RCA.  Cardiology recommended DAPT with aspirin  and Brilinta  minimum for 1 year along with Lipitor - CTA chest is negative for PE but does have four-vessel disease - LDL 82, TSH normal, A1c 5.4  OSA on CPAP  Hyperlipidemia - LDL 82.  Lipitor  Mild transaminitis -AST > ALT   DVT prophylaxis: Heparin  drip    Code Status: Limited: Do  not attempt resuscitation (DNR) -DNR-LIMITED -Do Not Intubate/DNI  Family Communication: Son present at bedside Status is: Inpatient Remains inpatient appropriate because:  PT Follow up Recs:   Subjective:  Tolerated LHC yesterday Doing well this morning, denies any chest pain.  Wishing to go home  Examination:  General exam: Appears calm and comfortable  Respiratory system: Clear to auscultation. Respiratory effort normal. Cardiovascular system: S1 & S2 heard, RRR. No JVD, murmurs, rubs, gallops or clicks. No pedal edema. Gastrointestinal system: Abdomen is nondistended, soft and nontender. No organomegaly or masses felt. Normal bowel sounds heard. Central nervous system: Alert and oriented. No focal neurological deficits. Extremities: Symmetric 5 x 5 power. Skin: No rashes, lesions or ulcers Psychiatry: Judgement and insight appear normal. Mood & affect appropriate.    Discharge Diagnoses:  Principal Problem:   NSTEMI (non-ST elevated myocardial infarction) (HCC) Active Problems:   Obstructive sleep apnea   HTN (hypertension)   ACS (acute coronary syndrome) (HCC)      Discharge Exam: Vitals:   11/10/24 0435 11/10/24 0758  BP: 131/65 (!) 116/58  Pulse: 64   Resp: 18 18  Temp:  98.2 F (36.8 C)  SpO2: 96%    Vitals:   11/09/24 2034 11/10/24 0006 11/10/24 0435 11/10/24 0758  BP: 121/62 135/63 131/65 (!) 116/58  Pulse: 72 66 64   Resp: 18 19 18 18   Temp: 99.6 F (37.6 C) 99.2 F (37.3 C)  98.2 F (36.8 C)  TempSrc: Oral Oral  Oral  SpO2: 94% 99% 96%   Weight:      Height:          Discharge Instructions  Discharge Instructions  Amb Referral to Cardiac Rehabilitation   Complete by: As directed    Diagnosis:  NSTEMI Coronary Stents     After initial evaluation and assessments completed: Virtual Based Care may be provided alone or in conjunction with Phase 2 Cardiac Rehab based on patient barriers.: Yes   Intensive Cardiac Rehabilitation (ICR) [facility]  location only OR Traditional Cardiac Rehabilitation (TCR) *If criteria for ICR are not met will enroll in TCR ([facility] only): Yes      Allergies as of 11/10/2024       Reactions   Sulfa Antibiotics Rash, Dermatitis        Medication List     STOP taking these medications    ibuprofen 200 MG tablet Commonly known as: ADVIL   pravastatin  20 MG tablet Commonly known as: PRAVACHOL        TAKE these medications    acetaminophen  500 MG tablet Commonly known as: TYLENOL  Take 500 mg by mouth every 6 (six) hours as needed for mild pain (pain score 1-3), headache or moderate pain (pain score 4-6).   amLODipine  5 MG tablet Commonly known as: NORVASC  Take 1 tablet (5 mg total) by mouth daily.   aspirin  81 MG chewable tablet Chew 1 tablet (81 mg total) by mouth daily.   atorvastatin  80 MG tablet Commonly known as: LIPITOR Take 1 tablet (80 mg total) by mouth daily.   busPIRone  5 MG tablet Commonly known as: BUSPAR  Take 5 mg by mouth in the morning and at bedtime.   escitalopram  20 MG tablet Commonly known as: LEXAPRO  Take 20 mg by mouth daily.   losartan  100 MG tablet Commonly known as: COZAAR  Take 100 mg by mouth in the morning.   nitroGLYCERIN  0.4 MG SL tablet Commonly known as: NITROSTAT  Place 1 tablet (0.4 mg total) under the tongue every 5 (five) minutes as needed for chest pain.   Olopatadine HCl 0.2 % Soln Place 1-2 drops into both eyes 2 (two) times daily as needed (eye irritation).   propranolol  10 MG tablet Commonly known as: INDERAL  Take 10 mg by mouth daily.   ticagrelor  90 MG Tabs tablet Commonly known as: BRILINTA  Take 1 tablet (90 mg total) by mouth 2 (two) times daily.   Vitamin D 50 MCG (2000 UT) tablet Take 2,000 Units by mouth in the morning.        Allergies[1]  You were cared for by a hospitalist during your hospital stay. If you have any questions about your discharge medications or the care you received while you were in the hospital  after you are discharged, you can call the unit and asked to speak with the hospitalist on call if the hospitalist that took care of you is not available. Once you are discharged, your primary care physician will handle any further medical issues. Please note that no refills for any discharge medications will be authorized once you are discharged, as it is imperative that you return to your primary care physician (or establish a relationship with a primary care physician if you do not have one) for your aftercare needs so that they can reassess your need for medications and monitor your lab values.  You were cared for by a hospitalist during your hospital stay. If you have any questions about your discharge medications or the care you received while you were in the hospital after you are discharged, you can call the unit and asked to speak with the hospitalist on call if the hospitalist that took care of you  is not available. Once you are discharged, your primary care physician will handle any further medical issues. Please note that NO REFILLS for any discharge medications will be authorized once you are discharged, as it is imperative that you return to your primary care physician (or establish a relationship with a primary care physician if you do not have one) for your aftercare needs so that they can reassess your need for medications and monitor your lab values.  Please request your Prim.MD to go over all Hospital Tests and Procedure/Radiological results at the follow up, please get all Hospital records sent to your Prim MD by signing hospital release before you go home.  Get CBC, CMP, 2 view Chest X ray checked  by Primary MD during your next visit or SNF MD in 5-7 days ( we routinely change or add medications that can affect your baseline labs and fluid status, therefore we recommend that you get the mentioned basic workup next visit with your PCP, your PCP may decide not to get them or add new tests  based on their clinical decision)  On your next visit with your primary care physician please Get Medicines reviewed and adjusted.  If you experience worsening of your admission symptoms, develop shortness of breath, life threatening emergency, suicidal or homicidal thoughts you must seek medical attention immediately by calling 911 or calling your MD immediately  if symptoms less severe.  You Must read complete instructions/literature along with all the possible adverse reactions/side effects for all the Medicines you take and that have been prescribed to you. Take any new Medicines after you have completely understood and accpet all the possible adverse reactions/side effects.   Do not drive, operate heavy machinery, perform activities at heights, swimming or participation in water  activities or provide baby sitting services if your were admitted for syncope or siezures until you have seen by Primary MD or a Neurologist and advised to do so again.  Do not drive when taking Pain medications.   Procedures/Studies: CARDIAC CATHETERIZATION Result Date: 11/09/2024 Images from the original result were not included. Cardiac Catheterization 11/09/24: Hemodynamic data: LVEDP 20 mmHg.  There is no pressure gradient across the aortic valve. Angiographic data: LM: Very large-caliber vessel, mild coronary calcification is evident. LAD: Large-caliber vessel, has mild diffuse disease.  Small D1 and a moderate-sized D2.  Proximal LAD has mild disease. RI: Moderate caliber vessel with secondary branch.  No significant disease. Cx: Large vessel, gives origin to large OM 3 with a 60% stenosis at the crux of the OM 3.  OM1 and 2 are very small. RCA: Dominant vessel.  Large vessel.  Proximal segment has mild 20% stenosis.  Distal RCA prior to bifurcation has a focal napkin ring like 99% stenosis. Intervention data: Successful stenting with 2.5 x 16 mm Synergy XD DES deployed at 18 atmospheric pressure, stenosis reduced  from 99% to 0% with TIMI-3 to TIMI-3 flow.       Impression and recommendations: Aspirin  indefinitely, Brilinta  for a year in view of ACS.   ECHOCARDIOGRAM COMPLETE Result Date: 11/09/2024    ECHOCARDIOGRAM REPORT   Patient Name:   Jeremy Finley Date of Exam: 11/09/2024 Medical Rec #:  [id number]          Height:       70.0 in Accession #:    [id number]         Weight:       198.0 lb Date of Birth:  1935-08-22  BSA:          2.078 m Patient Age:    89 years           BP:           152/74 mmHg Patient Gender: M                  HR:           55 bpm. Exam Location:  Inpatient Procedure: 2D Echo, Cardiac Doppler, Color Doppler and Intracardiac            Opacification Agent (Both Spectral and Color Flow Doppler were            utilized during procedure). Indications:    Chest Pain  History:        Patient has no prior history of Echocardiogram examinations.                 Acute MI, Signs/Symptoms:Chest Pain; Risk Factors:Hypertension,                 Sleep Apnea, Dyslipidemia and Former Smoker.  Sonographer:    Juliene Rucks Referring Phys: [id number] DAMARCUS A INGRAM IMPRESSIONS  1. Left ventricular ejection fraction, by estimation, is 55 to 60%. The left ventricle has normal function. The left ventricle demonstrates regional wall motion abnormalities (see scoring diagram/findings for description). There is mild concentric left ventricular hypertrophy. Left ventricular diastolic parameters are consistent with Grade I diastolic dysfunction (impaired relaxation).  2. Right ventricular systolic function is normal. The right ventricular size is normal.  3. The mitral valve is degenerative. Trivial mitral valve regurgitation. No evidence of mitral stenosis.  4. The aortic valve is tricuspid. There is mild calcification of the aortic valve. Aortic valve regurgitation is not visualized. Aortic valve sclerosis/calcification is present, without any evidence of aortic stenosis.  5. Aortic dilatation noted. There is  mild dilatation of the aortic root, measuring 42 mm.  6. The inferior vena cava is normal in size with greater than 50% respiratory variability, suggesting right atrial pressure of 3 mmHg. FINDINGS  Left Ventricle: Left ventricular ejection fraction, by estimation, is 55 to 60%. The left ventricle has normal function. The left ventricle demonstrates regional wall motion abnormalities. Definity  contrast agent was given IV to delineate the left ventricular endocardial borders. The left ventricular internal cavity size was normal in size. There is mild concentric left ventricular hypertrophy. Left ventricular diastolic parameters are consistent with Grade I diastolic dysfunction (impaired relaxation).  LV Wall Scoring: The inferior wall is hypokinetic. Right Ventricle: The right ventricular size is normal. No increase in right ventricular wall thickness. Right ventricular systolic function is normal. Left Atrium: Left atrial size was normal in size. Right Atrium: Right atrial size was normal in size. Pericardium: There is no evidence of pericardial effusion. Mitral Valve: The mitral valve is degenerative in appearance. Trivial mitral valve regurgitation. No evidence of mitral valve stenosis. Tricuspid Valve: The tricuspid valve is normal in structure. Tricuspid valve regurgitation is trivial. No evidence of tricuspid stenosis. Aortic Valve: The aortic valve is tricuspid. There is mild calcification of the aortic valve. Aortic valve regurgitation is not visualized. Aortic valve sclerosis/calcification is present, without any evidence of aortic stenosis. Pulmonic Valve: The pulmonic valve was normal in structure. Pulmonic valve regurgitation is mild. No evidence of pulmonic stenosis. Aorta: Aortic dilatation noted. There is mild dilatation of the aortic root, measuring 42 mm. Venous: The inferior vena cava is normal in size with greater than 50%  respiratory variability, suggesting right atrial pressure of 3 mmHg.  IAS/Shunts: No atrial level shunt detected by color flow Doppler.  LEFT VENTRICLE PLAX 2D LVIDd:         4.60 cm      Diastology LVIDs:         3.30 cm      LV e' medial:    4.90 cm/s LV PW:         1.10 cm      LV E/e' medial:  11.4 LV IVS:        1.30 cm      LV e' lateral:   5.87 cm/s LVOT diam:     2.10 cm      LV E/e' lateral: 9.6 LV SV:         73 LV SV Index:   35 LVOT Area:     3.46 cm  LV Volumes (MOD) LV vol d, MOD A2C: 118.0 ml LV vol d, MOD A4C: 145.0 ml LV vol s, MOD A2C: 49.0 ml LV vol s, MOD A4C: 57.2 ml LV SV MOD A2C:     69.0 ml LV SV MOD A4C:     145.0 ml LV SV MOD BP:      78.8 ml RIGHT VENTRICLE RV Basal diam:  3.10 cm RV Mid diam:    3.10 cm RV S prime:     15.40 cm/s TAPSE (M-mode): 2.0 cm LEFT ATRIUM             Index        RIGHT ATRIUM           Index LA diam:        3.30 cm 1.59 cm/m   RA Area:     15.90 cm LA Vol (A2C):   51.2 ml 24.63 ml/m  RA Volume:   34.10 ml  16.41 ml/m LA Vol (A4C):   41.8 ml 20.11 ml/m LA Biplane Vol: 47.5 ml 22.85 ml/m  AORTIC VALVE LVOT Vmax:   81.90 cm/s LVOT Vmean:  53.000 cm/s LVOT VTI:    0.210 m  AORTA Ao Root diam: 4.20 cm Ao Asc diam:  3.20 cm MITRAL VALVE MV Area (PHT): 2.48 cm    SHUNTS MV Decel Time: 306 msec    Systemic VTI:  0.21 m MV E velocity: 56.10 cm/s  Systemic Diam: 2.10 cm MV A velocity: 99.20 cm/s MV E/A ratio:  0.57 Toribio Fuel MD Electronically signed by Toribio Fuel MD Signature Date/Time: 11/09/2024/1:59:53 PM    Final    CT Angio Chest/Abd/Pel for Dissection W and/or Wo Contrast Result Date: 11/08/2024 EXAM: CTA CHEST 11/08/2024 07:58:00 PM TECHNIQUE: CTA of the chest was performed without and with the administration of 85 mL of intravenous iohexol  (OMNIPAQUE ) 350 MG/ML injection. Multiplanar reformatted images are provided for review. MIP images are provided for review. Automated exposure control, iterative reconstruction, and/or weight based adjustment of the mA/kV was utilized to reduce the radiation dose to as low  as reasonably achievable. COMPARISON: None available. CLINICAL HISTORY: Blood pressure 200, new chest pain. STEMI and route but canceled on arrival. Rule out dissection for chest pain. FINDINGS: PULMONARY ARTERIES: Pulmonary arteries are adequately opacified for evaluation. No acute pulmonary embolus. Main pulmonary artery is normal in caliber. MEDIASTINUM: Enlarged right atrium. 4-vessel coronary artery calcification. The thoracic aorta is normal in caliber. No thoracic aorta dissection. Mild atherosclerotic plaque of the aorta. The esophagus is unremarkable. The thyroid gland is unremarkable. The pericardium demonstrates no acute abnormality.  LYMPH NODES: Associated calcified right hilar lymph nodes. No mediastinal or axillary lymphadenopathy. LUNGS AND PLEURA: Multiple scattered pulmonary micronodules, calcified and noncalcified, within the right lung. Associated calcified right hilar lymph nodes. Findings suggestive of prior granulomatous disease. Largest nodule measure up to 5 mm within the right lower lobe (6:131). No focal consolidation or pulmonary edema. No evidence of pleural effusion or pneumothorax. UPPER ABDOMEN: The abdominal aorta is normal in caliber. No abdominal aorta dissection. Tiny hiatal hernia. Fluid-density lesions of the kidneys likely represent simple renal cysts. Simple renal cysts do not require additional follow-up unless clinically indicated due to signs/symptoms. No small or large bowel thickening or dilatation. The appendix is unremarkable. Colonic diverticulosis. The prostate is enlarged, measuring up to 5.5 cm. Small fat-containing right inguinal hernia. SOFT TISSUES AND BONES: Multilevel moderate severity degenerative changes of the spine. No acute displaced fracture. No suspicious lytic or blastic osseous lesions. Diffusely decreased bone density. No acute soft tissue abnormality. IMPRESSION: 1. No thoracic or abdominal aorta dissection. 2. Atherosclerotic plaque with 4-vessel  coronary artery calcifications. 3. 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 4. Other, non-acute and/or normal findings as above. Electronically signed by: Morgane Naveau MD 11/08/2024 08:11 PM EST RP Workstation: [id number]     The results of significant diagnostics from this hospitalization (including imaging, microbiology, ancillary and laboratory) are listed below for reference.     Microbiology: No results found for this or any previous visit (from the past 240 hours).   Labs: BNP (last 3 results) No results for input(s): BNP in the last 8760 hours. Basic Metabolic Panel: Recent Labs  Lab 11/08/24 1829 11/08/24 1857 11/09/24 0145 11/10/24 0347  NA 138 141 137 137  K 4.2 4.0 3.9 4.5  CL 103 104 103 102  CO2 28  --  24 25  GLUCOSE 110* 105* 95 95  BUN 24* 26* 20 17  CREATININE 1.23 1.30* 1.02 1.16  CALCIUM  8.9  --  8.7* 8.9  MG  --   --   --  2.0  PHOS  --   --   --  2.7   Liver Function Tests: Recent Labs  Lab 11/08/24 1829 11/09/24 0145 11/10/24 0347  AST 51* 95* 68*  ALT 18 20 17   ALKPHOS 95 90 92  BILITOT 0.6 0.8 1.3*  PROT 6.4* 6.1* 5.8*  ALBUMIN 3.7 3.4* 3.4*   Recent Labs  Lab 11/08/24 1829  LIPASE 34   No results for input(s): AMMONIA in the last 168 hours. CBC: Recent Labs  Lab 11/08/24 1829 11/08/24 1857 11/09/24 0145 11/10/24 0347  WBC 8.4  --  12.5* 9.9  NEUTROABS 5.6  --   --   --   HGB 14.7 14.3 14.2 13.4  HCT 43.7 42.0 41.9 39.2  MCV 95.8  --  95.0 94.2  PLT 165  --  148* 144*   Cardiac Enzymes: No results for input(s): CKTOTAL, CKMB, CKMBINDEX, TROPONINI in the last 168 hours. BNP: Invalid input(s): POCBNP CBG: Recent Labs  Lab 11/09/24 1924 11/10/24 0004 11/10/24 0631  GLUCAP 101* 106* 97   D-Dimer No results for input(s): DDIMER in the last 72 hours. Hgb A1c Recent Labs    11/09/24 0145  HGBA1C 5.4   Lipid Profile Recent Labs    11/08/24 1829  CHOL 152  HDL 40*  LDLCALC 82  TRIG  151*  CHOLHDL 3.8   Thyroid function studies Recent Labs    11/09/24 0145  TSH 4.230   Anemia work up No  results for input(s): VITAMINB12, FOLATE, FERRITIN, TIBC, IRON, RETICCTPCT in the last 72 hours. Urinalysis    Component Value Date/Time   COLORURINE YELLOW 06/16/2024 1135   APPEARANCEUR CLEAR 06/16/2024 1135   LABSPEC 1.024 06/16/2024 1135   PHURINE 6.0 06/16/2024 1135   GLUCOSEU NEGATIVE 06/16/2024 1135   HGBUR NEGATIVE 06/16/2024 1135   BILIRUBINUR NEGATIVE 06/16/2024 1135   KETONESUR NEGATIVE 06/16/2024 1135   PROTEINUR TRACE (A) 06/16/2024 1135   UROBILINOGEN 0.2 12/14/2008 1030   NITRITE NEGATIVE 06/16/2024 1135   LEUKOCYTESUR NEGATIVE 06/16/2024 1135   Sepsis Labs Recent Labs  Lab 11/08/24 1829 11/09/24 0145 11/10/24 0347  WBC 8.4 12.5* 9.9   Microbiology No results found for this or any previous visit (from the past 240 hours).   Time coordinating discharge:  I have spent 35 minutes face to face with the patient and on the ward discussing the patients care, assessment, plan and disposition with other care givers. >50% of the time was devoted counseling the patient about the risks and benefits of treatment/Discharge disposition and coordinating care.   SIGNED:   Burgess JAYSON Dare, MD  [facility] 11/10/2024, 11:12 AM   If 7PM-7AM, please contact night-coverage      [1]  Allergies Allergen Reactions   Sulfa Antibiotics Rash and Dermatitis

## 2024-11-10 NOTE — Telephone Encounter (Signed)
 Patient Product/process development scientist completed.    The patient is insured through [employer]. Patient has Medicare and is not eligible for a copay card, but may be able to apply for patient assistance or Medicare RX Payment Plan (Patient Must reach out to their plan, if eligible for payment plan), if available.    Ran test claim for ticagrelor (Brilinta) 90 mgand the current 30 day co-pay is $21.21.   This test claim was processed through [facility]- copay amounts may vary at other pharmacies due to pharmacy/plan contracts, or as the patient moves through the different stages of their insurance plan.     Reyes Sharps, CPHT Pharmacy Technician Patient Advocate Specialist Lead [facility] Patient Advocate Team Direct Number: [phone]  Fax: [phone]

## 2024-11-10 NOTE — TOC CM/SW Note (Signed)
 Transition of Care [facility]) - Inpatient Brief Assessment   Patient Details  Name: Jeremy Finley MRN: [id number] Date of Birth: 21-Jan-1935  Transition of Care [facility]) CM/SW Contact:    Sudie Erminio Deems, RN Phone Number: 11/10/2024, 10:43 AM   Clinical Narrative: Patient presented for chest pain-Stemi. PTA patient was independent from home with spouse and has support of son. Patient does not use any DME in the home. Brilinta  benefits check completed $21.21. Son is at the bedside and will provide transportation home via private vehicle. No home needs identified at this time.    Transition of Care Asessment: Insurance and Status: Insurance coverage has been reviewed Patient has primary care physician: Yes Home environment has been reviewed: reviewed Prior level of function:: independent Prior/Current Home Services: No current home services Social Drivers of Health Review: SDOH reviewed no interventions necessary Readmission risk has been reviewed: Yes Transition of care needs: no transition of care needs at this time

## 2024-11-10 NOTE — Plan of Care (Signed)
  Problem: Clinical Measurements: Goal: Cardiovascular complication will be avoided Outcome: Progressing   Problem: Activity: Goal: Risk for activity intolerance will decrease Outcome: Progressing   Problem: Pain Managment: Goal: General experience of comfort will improve and/or be controlled Outcome: Progressing   Problem: Safety: Goal: Ability to remain free from injury will improve Outcome: Progressing

## 2024-11-11 LAB — LIPOPROTEIN A (LPA): Lipoprotein (a): 24.2 nmol/L

## 2024-11-15 ENCOUNTER — Encounter (HOSPITAL_COMMUNITY): Payer: Self-pay | Admitting: Internal Medicine

## 2024-11-16 ENCOUNTER — Telehealth (HOSPITAL_COMMUNITY): Payer: Self-pay

## 2024-11-16 NOTE — Telephone Encounter (Signed)
 Pt insurance is active and benefits verified through [facility] Co-pay $50, DED 0/0 met, out of pocket $3,950/0 met, co-insurance 0%. no pre-authorization required, Vince/[employer]ad[employer] 11/16/2024@11 :20, REF# [id number]   TCR/ICR? ICR Visit(date of service)limitation? No limit Can multiple codes be used on the same date of service/visit?(IF ITS A LIMIT) yes    Is this a lifetime maximum or an annual maximum? annual Has the member used any of these services to date? no Is there a time limit (weeks/months) on start of program and/or program completion? no   Will contact patient to see if he is interested in the Cardiac Rehab Program. If interested, patient will need to complete follow up appt. Once completed, patient will be contacted for scheduling upon review by the RN Navigator.

## 2024-11-16 NOTE — Telephone Encounter (Signed)
 Called patient to see if he is interested in the Cardiac Rehab Program. Patient expressed interest. Explained scheduling process and went over insurance, patient verbalized understanding. Will contact patient for scheduling once f/u has been completed.

## 2024-11-22 ENCOUNTER — Ambulatory Visit: Admitting: Nurse Practitioner

## 2025-01-11 ENCOUNTER — Ambulatory Visit: Admitting: Podiatry
